# Patient Record
Sex: Female | Born: 1960
Health system: Southern US, Community
[De-identification: ages and names within clinical notes are randomized; demographics above are authoritative.]

## PROBLEM LIST (undated history)

## (undated) DIAGNOSIS — I2510 Atherosclerotic heart disease of native coronary artery without angina pectoris: Secondary | ICD-10-CM

## (undated) DIAGNOSIS — E119 Type 2 diabetes mellitus without complications: Secondary | ICD-10-CM

## (undated) DIAGNOSIS — T7840XA Allergy, unspecified, initial encounter: Secondary | ICD-10-CM

## (undated) DIAGNOSIS — I10 Essential (primary) hypertension: Secondary | ICD-10-CM

## (undated) DIAGNOSIS — E785 Hyperlipidemia, unspecified: Secondary | ICD-10-CM

## (undated) DIAGNOSIS — M542 Cervicalgia: Secondary | ICD-10-CM

## (undated) HISTORY — PX: TUBAL LIGATION: SHX77

## (undated) HISTORY — DX: Allergy, unspecified, initial encounter: T78.40XA

---

## 2019-02-22 ENCOUNTER — Other Ambulatory Visit: Payer: Self-pay | Admitting: Student

## 2019-02-22 DIAGNOSIS — M4802 Spinal stenosis, cervical region: Secondary | ICD-10-CM

## 2019-02-22 DIAGNOSIS — M47812 Spondylosis without myelopathy or radiculopathy, cervical region: Secondary | ICD-10-CM

## 2019-02-22 DIAGNOSIS — M5412 Radiculopathy, cervical region: Secondary | ICD-10-CM

## 2019-03-04 ENCOUNTER — Ambulatory Visit
Admission: RE | Admit: 2019-03-04 | Discharge: 2019-03-04 | Disposition: A | Discharge status: Home / Self Care | Payer: BC Managed Care – PPO | Source: Ambulatory Visit | Attending: Student | Admitting: Student

## 2019-03-04 ENCOUNTER — Other Ambulatory Visit: Payer: Self-pay

## 2019-03-04 DIAGNOSIS — M47812 Spondylosis without myelopathy or radiculopathy, cervical region: Secondary | ICD-10-CM | POA: Diagnosis present

## 2019-03-04 DIAGNOSIS — M5412 Radiculopathy, cervical region: Secondary | ICD-10-CM | POA: Diagnosis not present

## 2019-03-04 DIAGNOSIS — M4802 Spinal stenosis, cervical region: Secondary | ICD-10-CM | POA: Insufficient documentation

## 2020-03-07 ENCOUNTER — Encounter: Payer: Self-pay | Admitting: Internal Medicine

## 2020-03-07 ENCOUNTER — Ambulatory Visit (INDEPENDENT_AMBULATORY_CARE_PROVIDER_SITE_OTHER): Payer: 59 | Admitting: Internal Medicine

## 2020-03-07 ENCOUNTER — Other Ambulatory Visit: Payer: Self-pay

## 2020-03-07 VITALS — BP 170/79 | HR 78 | Ht 65.0 in | Wt 156.0 lb

## 2020-03-07 DIAGNOSIS — E119 Type 2 diabetes mellitus without complications: Secondary | ICD-10-CM | POA: Insufficient documentation

## 2020-03-07 DIAGNOSIS — M8949 Other hypertrophic osteoarthropathy, multiple sites: Secondary | ICD-10-CM

## 2020-03-07 DIAGNOSIS — I10 Essential (primary) hypertension: Secondary | ICD-10-CM | POA: Insufficient documentation

## 2020-03-07 DIAGNOSIS — M159 Polyosteoarthritis, unspecified: Secondary | ICD-10-CM

## 2020-03-07 DIAGNOSIS — E1169 Type 2 diabetes mellitus with other specified complication: Secondary | ICD-10-CM

## 2020-03-07 DIAGNOSIS — M542 Cervicalgia: Secondary | ICD-10-CM | POA: Diagnosis not present

## 2020-03-07 DIAGNOSIS — I152 Hypertension secondary to endocrine disorders: Secondary | ICD-10-CM

## 2020-03-07 LAB — GLUCOSE, POCT (MANUAL RESULT ENTRY): POC Glucose: 237 mg/dl — AB (ref 70–99)

## 2020-03-07 MED ORDER — KETOROLAC TROMETHAMINE 30 MG/ML IJ SOLN
30.0000 mg | Freq: Once | INTRAMUSCULAR | Status: AC
  Administered 2020-03-07: 30 mg via INTRAMUSCULAR

## 2020-03-07 MED ORDER — DICLOFENAC SODIUM 75 MG PO TBEC: 75.0000 mg | DELAYED_RELEASE_TABLET | Freq: Two times a day (BID) | ORAL | 0 refills | Status: DC

## 2020-03-07 NOTE — Assessment & Plan Note (Signed)
Patient blood pressure was found to be elevated today she has been consuming a lot of NSAIDs Tylenol because of the pain in the left shoulder neck.

## 2020-03-07 NOTE — Assessment & Plan Note (Signed)
Patient was advised to use neck brace and use a cervical pillow to sleep at night.

## 2020-03-07 NOTE — Assessment & Plan Note (Signed)
Patient blood sugar is elevated due to noncompliance with her diet.

## 2020-03-07 NOTE — Progress Notes (Signed)
Established Patient Office Visit  Subjective:  Patient ID: Elizabeth Hardin, female    DOB: 1960-08-17  Age: 60 y.o. MRN: [id number]  CC:  Chief Complaint  Patient presents with  . Shoulder Pain    Left shoulder, pain starts in her neck and runs down arm into hand. Has been going on for 2 days.     Shoulder Pain  This is a recurrent problem. The current episode started in the past 7 days. The problem occurs 2 to 4 times per day. The problem has been gradually worsening. The pain is at a severity of 9/10. The pain is severe. Associated symptoms include joint swelling and stiffness. Pertinent negatives include no fever or itching. She has tried NSAIDS for the symptoms. The treatment provided mild relief. Family history does not include gout. Her past medical history is significant for diabetes and osteoarthritis.    Elizabeth Hardin presents for shoulder pain  Lt side  History reviewed. No pertinent past medical history.  History reviewed. No pertinent surgical history.  History reviewed. No pertinent family history.  Social History   Socioeconomic History  . Marital status: Married    Spouse name: Not on file  . Number of children: Not on file  . Years of education: Not on file  . Highest education level: Not on file  Occupational History  . Not on file  Tobacco Use  . Smoking status: Current Every Day Smoker  . Smokeless tobacco: Never Used  Substance and Sexual Activity  . Alcohol use: Not Currently  . Drug use: Never  . Sexual activity: Yes  Other Topics Concern  . Not on file  Social History Narrative  . Not on file   Social Determinants of Health   Financial Resource Strain: Not on file  Food Insecurity: Not on file  Transportation Needs: Not on file  Physical Activity: Not on file  Stress: Not on file  Social Connections: Not on file  Intimate Partner Violence: Not on file     Current Outpatient Medications:  .  diclofenac (VOLTAREN) 75 MG EC  tablet, Take 1 tablet (75 mg total) by mouth 2 (two) times daily., Disp: 30 tablet, Rfl: 0   Not on File  ROS Review of Systems  Constitutional: Negative.  Negative for fever.  HENT: Negative.   Eyes: Negative.   Respiratory: Negative.   Cardiovascular: Negative.   Gastrointestinal: Negative.   Endocrine: Negative.   Genitourinary: Negative.   Musculoskeletal: Positive for arthralgias, neck pain, neck stiffness and stiffness.  Skin: Negative.  Negative for itching.  Allergic/Immunologic: Negative.   Neurological: Negative.  Negative for syncope.  Hematological: Negative.   Psychiatric/Behavioral: Negative.   All other systems reviewed and are negative.     Objective:    Physical Exam Vitals reviewed.  Constitutional:      Appearance: Normal appearance.  HENT:     Mouth/Throat:     Mouth: Mucous membranes are moist.  Eyes:     Pupils: Pupils are equal, round, and reactive to light.  Neck:     Vascular: No carotid bruit.  Cardiovascular:     Rate and Rhythm: Normal rate and regular rhythm.     Pulses: Normal pulses.     Heart sounds: Normal heart sounds.  Pulmonary:     Effort: Pulmonary effort is normal.     Breath sounds: Normal breath sounds.  Abdominal:     General: Bowel sounds are normal.     Palpations: Abdomen is soft.  There is no hepatomegaly, splenomegaly or mass.     Tenderness: There is no abdominal tenderness.     Hernia: No hernia is present.  Musculoskeletal:        General: No tenderness.     Cervical back: Neck supple.     Right lower leg: No edema.     Left lower leg: No edema.     Comments: Patient came with back pain in the shoulder region and radiated from the left to left shoulder on the left side.  Movement of the neck also causes shooting pain to the left shoulder she describes her pain to be involved 9 x 10.  Denies any history of chest pain sweating nausea vomiting trouble swallowing.  Skin:    Findings: No rash.  Neurological:      Mental Status: She is alert and oriented to person, place, and time.     Motor: No weakness.  Psychiatric:        Mood and Affect: Mood and affect normal.        Behavior: Behavior normal.     BP (!) 170/79   Pulse 78   Ht 5' 5"  (1.651 m)   Wt 156 lb (70.8 kg)   BMI 25.96 kg/m  Wt Readings from Last 3 Encounters:  03/07/20 156 lb (70.8 kg)     Health Maintenance Due  Topic Date Due  . HEMOGLOBIN A1C  Never done  . Hepatitis C Screening  Never done  . PNEUMOCOCCAL POLYSACCHARIDE VACCINE AGE 25-64 HIGH RISK  Never done  . COVID-19 Vaccine (1) Never done  . FOOT EXAM  Never done  . OPHTHALMOLOGY EXAM  Never done  . HIV Screening  Never done  . TETANUS/TDAP  Never done  . PAP SMEAR-Modifier  Never done  . COLONOSCOPY (Pts 45-35yr Insurance coverage will need to be confirmed)  Never done  . MAMMOGRAM  Never done  . INFLUENZA VACCINE  Never done    There are no preventive care reminders to display for this patient.  No results found for: TSH No results found for: WBC, HGB, HCT, MCV, PLT No results found for: NA, K, CHLORIDE, CO2, GLUCOSE, BUN, CREATININE, BILITOT, ALKPHOS, AST, ALT, PROT, ALBUMIN, CALCIUM, ANIONGAP, EGFR, GFR No results found for: CHOL No results found for: HDL No results found for: LDLCALC No results found for: TRIG No results found for: CHOLHDL No results found for: HGBA1C    Assessment & Plan:   Problem List Items Addressed This Visit      Cardiovascular and Mediastinum   Hypertension due to endocrine disorder    Patient blood pressure was found to be elevated today she has been consuming a lot of NSAIDs Tylenol because of the pain in the left shoulder neck.        Endocrine   Diabetes mellitus ([place]    Patient blood sugar is elevated due to noncompliance with her diet.      Relevant Orders   POCT glucose (manual entry) (Completed)     Musculoskeletal and Integument   Primary osteoarthritis involving multiple joints     Patient has osteoarthritis of the both hands finger joint.  she also seems to be have cervical spine osteoarthritis with impingement syndrome.  Patient was given Toradol injection and will get an x-ray of the neck as well as orthopedic consultation.      Relevant Medications   diclofenac (VOLTAREN) 75 MG EC tablet     Other   Neck pain  Patient was advised to use neck brace and use a cervical pillow to sleep at night.      Relevant Medications   diclofenac (VOLTAREN) 75 MG EC tablet   Other Relevant Orders   DG Neck Soft Tissue   Ambulatory referral to Orthopedics    Diagnosis  cervical spine osteoarthritis with impingement syndrome. Patient presented with cervical spine pain going to the left shoulder.  Movement of the neck bring all any more pain to the left shoulder region.  She describes her pain to be 9 x 10.  He was given a Toradol injection 30 mg intramuscular her blood sugar was also found to be elevated today she is taking Metformin 500 mg p.o. twice a day.  He also consumed some chocolate today because of the Valentine's Day.  Patient has been advised to stop smoking she will be referred to orthopedic surgeon.  X-ray of the neck will be obtained.    Meds ordered this encounter  Medications  . diclofenac (VOLTAREN) 75 MG EC tablet    Sig: Take 1 tablet (75 mg total) by mouth 2 (two) times daily.    Dispense:  30 tablet    Refill:  0  . ketorolac (TORADOL) 30 MG/ML injection 30 mg    Follow-up: No follow-ups on file.    Cletis Athens, MD

## 2020-03-07 NOTE — Assessment & Plan Note (Addendum)
Patient has osteoarthritis of the both hands finger joint.  she also seems to be have cervical spine osteoarthritis with impingement syndrome.  Patient was given Toradol injection and will get an x-ray of the neck as well as orthopedic consultation.

## 2020-03-08 ENCOUNTER — Ambulatory Visit
Admission: RE | Admit: 2020-03-08 | Discharge: 2020-03-08 | Disposition: A | Discharge status: Home / Self Care | Payer: 59 | Source: Ambulatory Visit | Attending: Internal Medicine | Admitting: Internal Medicine

## 2020-03-08 ENCOUNTER — Ambulatory Visit
Admission: RE | Admit: 2020-03-08 | Discharge: 2020-03-08 | Disposition: A | Discharge status: Home / Self Care | Payer: 59 | Attending: Internal Medicine | Admitting: Internal Medicine

## 2020-03-08 DIAGNOSIS — M542 Cervicalgia: Secondary | ICD-10-CM | POA: Diagnosis not present

## 2020-03-13 ENCOUNTER — Other Ambulatory Visit: Payer: Self-pay

## 2020-03-13 ENCOUNTER — Ambulatory Visit (INDEPENDENT_AMBULATORY_CARE_PROVIDER_SITE_OTHER): Payer: 59 | Admitting: Internal Medicine

## 2020-03-13 VITALS — BP 145/80 | HR 84 | Ht 64.0 in | Wt 152.9 lb

## 2020-03-13 DIAGNOSIS — I152 Hypertension secondary to endocrine disorders: Secondary | ICD-10-CM

## 2020-03-13 DIAGNOSIS — M159 Polyosteoarthritis, unspecified: Secondary | ICD-10-CM

## 2020-03-13 DIAGNOSIS — M8949 Other hypertrophic osteoarthropathy, multiple sites: Secondary | ICD-10-CM

## 2020-03-13 DIAGNOSIS — M150 Primary generalized (osteo)arthritis: Secondary | ICD-10-CM

## 2020-03-13 DIAGNOSIS — M542 Cervicalgia: Secondary | ICD-10-CM

## 2020-03-13 DIAGNOSIS — E1169 Type 2 diabetes mellitus with other specified complication: Secondary | ICD-10-CM

## 2020-03-13 MED ORDER — CYCLOBENZAPRINE HCL 5 MG PO TABS: 5.0000 mg | ORAL_TABLET | Freq: Three times a day (TID) | ORAL | 1 refills | Status: DC | PRN

## 2020-03-13 NOTE — Assessment & Plan Note (Addendum)
Pt has degenerative disease of the multiple joint in the cervical spine she also has left forearm pain and narrowing of the spinal cord, compression at one level.  Patient will be sent to the specialist for further evaluation.  In the meantime patient was given some Flexeril 5 mg p.o. twice a day.  And also multivitamin tablet twice a day.  She was advised with neck brace

## 2020-03-13 NOTE — Assessment & Plan Note (Signed)
Patient will be referred to the cervical spine specialist.

## 2020-03-13 NOTE — Progress Notes (Signed)
Established Patient Office Visit  Subjective:  Patient ID: Elizabeth Hardin, female    DOB: December 17, 1960  Age: 60 y.o. MRN: [id number]  CC:  Chief Complaint  Patient presents with  . Neck Pain    Patient reports she is still having pain and the pain radiates to both shoulders.    HPI  [name]     Patient came in as a follow-up from the neck pain.she continues to complain of pain in the right shoulder and left shoulder.  An MRI was obtained in the hospital.  Which revealed cervical spine arthritis there is a minimal compression of the cervical canal C5-C7 level there is no evidence of myelopathy according to MRI.  There is a multiple degenerative arthritis noted C3-C4 and C5 and 6.  Disc space narrowing was also noted         No family history on file.  Social History   Socioeconomic History  . Marital status: Married    Spouse name: Not on file  . Number of children: Not on file  . Years of education: Not on file  . Highest education level: Not on file  Occupational History  . Not on file  Tobacco Use  . Smoking status: Current Every Day Smoker  . Smokeless tobacco: Never Used  Substance and Sexual Activity  . Alcohol use: Not Currently  . Drug use: Never  . Sexual activity: Yes  Other Topics Concern  . Not on file  Social History Narrative  . Not on file   Social Determinants of Health   Financial Resource Strain: Not on file  Food Insecurity: Not on file  Transportation Needs: Not on file  Physical Activity: Not on file  Stress: Not on file  Social Connections: Not on file  Intimate Partner Violence: Not on file     Current Outpatient Medications:  .  cyclobenzaprine (FLEXERIL) 5 MG tablet, Take 1 tablet (5 mg total) by mouth 3 (three) times daily as needed for muscle spasms., Disp: 30 tablet, Rfl: 1 .  diclofenac (VOLTAREN) 75 MG EC tablet, Take 1 tablet (75 mg total) by mouth 2 (two) times daily., Disp: 30 tablet, Rfl: 0   Not on  File  ROS Review of Systems  Constitutional: Negative.   HENT: Negative.   Eyes: Negative.   Respiratory: Negative.   Cardiovascular: Negative.   Gastrointestinal: Negative.   Endocrine: Negative.   Genitourinary: Negative.   Musculoskeletal: Positive for arthralgias and back pain. Negative for gait problem.       Patient has both shoulder pain and cervical spine pain with radiculopathy  Skin: Negative.   Allergic/Immunologic: Negative.   Neurological: Negative.   Hematological: Negative.   Psychiatric/Behavioral: Negative.   All other systems reviewed and are negative.     Objective:    Physical Exam Vitals reviewed.  Constitutional:      Appearance: Normal appearance.  HENT:     Mouth/Throat:     Mouth: Mucous membranes are moist.  Eyes:     Pupils: Pupils are equal, round, and reactive to light.  Neck:     Vascular: No carotid bruit.  Cardiovascular:     Rate and Rhythm: Normal rate and regular rhythm.     Pulses: Normal pulses.     Heart sounds: Normal heart sounds.  Pulmonary:     Effort: Pulmonary effort is normal.     Breath sounds: Normal breath sounds. No rhonchi or rales.  Abdominal:     General:  Bowel sounds are normal.     Palpations: Abdomen is soft. There is no hepatomegaly, splenomegaly or mass.     Tenderness: There is no abdominal tenderness.     Hernia: No hernia is present.  Musculoskeletal:        General: No tenderness.     Cervical back: Rigidity present.     Right lower leg: No edema.     Left lower leg: No edema.  Lymphadenopathy:     Cervical: No cervical adenopathy.  Skin:    Findings: No rash.  Neurological:     Mental Status: She is alert and oriented to person, place, and time.     Motor: No weakness.  Psychiatric:        Mood and Affect: Mood and affect normal.        Behavior: Behavior normal.     BP (!) 145/80   Pulse 84   Ht 5' 4"  (1.626 m)   Wt 152 lb 14.4 oz (69.4 kg)   SpO2 98%   BMI 26.25 kg/m  Wt Readings  from Last 3 Encounters:  03/14/20 152 lb 14.4 oz (69.4 kg)  03/07/20 156 lb (70.8 kg)     Health Maintenance Due  Topic Date Due  . HEMOGLOBIN A1C  Never done  . Hepatitis C Screening  Never done  . PNEUMOCOCCAL POLYSACCHARIDE VACCINE AGE 72-64 HIGH RISK  Never done  . COVID-19 Vaccine (1) Never done  . FOOT EXAM  Never done  . OPHTHALMOLOGY EXAM  Never done  . URINE MICROALBUMIN  Never done  . HIV Screening  Never done  . TETANUS/TDAP  Never done  . PAP SMEAR-Modifier  Never done  . COLONOSCOPY (Pts 45-14yr Insurance coverage will need to be confirmed)  Never done  . MAMMOGRAM  Never done  . INFLUENZA VACCINE  Never done    There are no preventive care reminders to display for this patient.  No results found for: TSH No results found for: WBC, HGB, HCT, MCV, PLT No results found for: NA, K, CHLORIDE, CO2, GLUCOSE, BUN, CREATININE, BILITOT, ALKPHOS, AST, ALT, PROT, ALBUMIN, CALCIUM, ANIONGAP, EGFR, GFR No results found for: CHOL No results found for: HDL No results found for: LDLCALC No results found for: TRIG No results found for: CHOLHDL No results found for: HGBA1C    Assessment & Plan:   Problem List Items Addressed This Visit      Cardiovascular and Mediastinum   Hypertension due to endocrine disorder    Blood pressure is stable at the present time        Endocrine   Diabetes mellitus [place]    Diabetes seems to be under control        Musculoskeletal and Integument   Primary osteoarthritis involving multiple joints - Primary    Pt has degenerative disease of the multiple joint in the cervical spine she also has left forearm pain and narrowing of the spinal cord, compression at one level.  Patient will be sent to the specialist for further evaluation.  In the meantime patient was given some Flexeril 5 mg p.o. twice a day.  And also multivitamin tablet twice a day.  She was advised with neck brace      Relevant Medications   cyclobenzaprine (FLEXERIL) 5 MG  tablet     Other   Neck pain    Patient will be referred to the cervical spine specialist.      Relevant Medications   cyclobenzaprine (FLEXERIL) 5 MG tablet  Meds ordered this encounter  Medications  . cyclobenzaprine (FLEXERIL) 5 MG tablet    Sig: Take 1 tablet (5 mg total) by mouth 3 (three) times daily as needed for muscle spasms.    Dispense:  30 tablet    Refill:  1    Follow-up: No follow-ups on file.    Cletis Athens, MD

## 2020-03-13 NOTE — Assessment & Plan Note (Signed)
Diabetes seems to be under control

## 2020-03-13 NOTE — Assessment & Plan Note (Signed)
Blood pressure is stable at the present time 

## 2020-03-14 ENCOUNTER — Encounter: Payer: Self-pay | Admitting: Internal Medicine

## 2020-03-17 ENCOUNTER — Encounter: Payer: Self-pay | Admitting: Internal Medicine

## 2020-04-03 ENCOUNTER — Ambulatory Visit: Payer: 59 | Admitting: Internal Medicine

## 2020-11-23 ENCOUNTER — Other Ambulatory Visit: Payer: Self-pay

## 2020-11-23 ENCOUNTER — Emergency Department: Payer: 59

## 2020-11-23 ENCOUNTER — Emergency Department
Admission: EM | Admit: 2020-11-23 | Discharge: 2020-11-23 | Disposition: A | Discharge status: Home / Self Care | Payer: 59 | Attending: Emergency Medicine | Admitting: Emergency Medicine

## 2020-11-23 DIAGNOSIS — F17200 Nicotine dependence, unspecified, uncomplicated: Secondary | ICD-10-CM | POA: Insufficient documentation

## 2020-11-23 DIAGNOSIS — E119 Type 2 diabetes mellitus without complications: Secondary | ICD-10-CM | POA: Diagnosis not present

## 2020-11-23 DIAGNOSIS — R1011 Right upper quadrant pain: Secondary | ICD-10-CM | POA: Diagnosis not present

## 2020-11-23 DIAGNOSIS — R109 Unspecified abdominal pain: Secondary | ICD-10-CM

## 2020-11-23 DIAGNOSIS — I10 Essential (primary) hypertension: Secondary | ICD-10-CM | POA: Insufficient documentation

## 2020-11-23 LAB — URINALYSIS, ROUTINE W REFLEX MICROSCOPIC
Bilirubin Urine: NEGATIVE
Glucose, UA: >=500 mg/dL — AB
Ketones, ur: 20 mg/dL — AB
Leukocytes,Ua: NEGATIVE
Nitrite: NEGATIVE
Protein, ur: NEGATIVE mg/dL
Specific Gravity, Urine: 1.023 (ref 1.005–1.030)
pH: 5 (ref 5.0–8.0)

## 2020-11-23 LAB — COMPREHENSIVE METABOLIC PANEL
ALT: 15 U/L (ref 0–44)
AST: 15 U/L (ref 15–41)
Albumin: 4.3 g/dL (ref 3.5–5.0)
Alkaline Phosphatase: 55 U/L (ref 38–126)
Anion gap: 9 (ref 5–15)
BUN: 17 mg/dL (ref 8–23)
CO2: 22 mmol/L (ref 22–32)
Calcium: 9.6 mg/dL (ref 8.9–10.3)
Chloride: 104 mmol/L (ref 98–111)
Creatinine, Ser: 0.76 mg/dL (ref 0.44–1.00)
GFR, Estimated: >60 mL/min (ref >60)
Glucose, Bld: 288 mg/dL — ABNORMAL HIGH (ref 70–99)
Potassium: 4.1 mmol/L (ref 3.5–5.1)
Sodium: 135 mmol/L (ref 135–145)
Total Bilirubin: 1 mg/dL (ref 0.3–1.2)
Total Protein: 7.3 g/dL (ref 6.5–8.1)

## 2020-11-23 LAB — LIPASE, BLOOD: Lipase: 36 U/L (ref 11–51)

## 2020-11-23 LAB — CBC
HCT: 39 % (ref 36.0–46.0)
Hemoglobin: 13.6 g/dL (ref 12.0–15.0)
MCH: 29.3 pg (ref 26.0–34.0)
MCHC: 34.9 g/dL (ref 30.0–36.0)
MCV: 84.1 fL (ref 80.0–100.0)
Platelets: 230 10*3/uL (ref 150–400)
RBC: 4.64 MIL/uL (ref 3.87–5.11)
RDW: 13.2 % (ref 11.5–15.5)
WBC: 7.2 10*3/uL (ref 4.0–10.5)
nRBC: 0.3 % — ABNORMAL HIGH (ref 0.0–0.2)

## 2020-11-23 MED ORDER — IOHEXOL 300 MG/ML  SOLN
100.0000 mL | Freq: Once | INTRAMUSCULAR | Status: DC | PRN
  Filled 2020-11-23: qty 100

## 2020-11-23 MED ORDER — IOHEXOL 350 MG/ML SOLN
80.0000 mL | Freq: Once | INTRAVENOUS | Status: AC | PRN
  Administered 2020-11-23: 80 mL via INTRAVENOUS
  Filled 2020-11-23: qty 80

## 2020-11-23 NOTE — ED Provider Notes (Signed)
[facility] Emergency Department Provider Note   ____________________________________________   I have reviewed the triage vital signs and the nursing notes.   HISTORY  Chief Complaint Abdominal Pain   History limited by: Not Limited   HPI Elizabeth Hardin is a 60 y.o. female who presents to the emergency department today because of concerns for abdominal pain.  Patient states she was at work when the pain started.  She was not doing any abnormal exertion.  She states the pain was located in the right upper abdomen and then radiated down the right side of her abdomen.  It was accompanied by some nausea and she felt like she might vomit.  At the time my exam the pain has improved.  She states that she still has some nagging feeling on that side.  Denies similar symptoms in the past.  No recent change in urine or bowel movements.  Patient denies any fevers.  No recent travel or unusual ingestions.   Records reviewed. Per medical record review patient has a history of diabetes. Hypertension.  History reviewed. No pertinent past medical history.  Patient Active Problem List   Diagnosis Date Noted   Neck pain 03/07/2020   Diabetes mellitus (HCC) 03/07/2020   Primary osteoarthritis involving multiple joints 03/07/2020   Hypertension due to endocrine disorder 03/07/2020    History reviewed. No pertinent surgical history.  Prior to Admission medications   Medication Sig Start Date End Date Taking? Authorizing Provider  cyclobenzaprine (FLEXERIL) 5 MG tablet Take 1 tablet (5 mg total) by mouth 3 (three) times daily as needed for muscle spasms. 03/13/20   Corky Downs, MD  diclofenac (VOLTAREN) 75 MG EC tablet Take 1 tablet (75 mg total) by mouth 2 (two) times daily. 03/07/20   Corky Downs, MD    Allergies Patient has no allergy information on record.  No family history on file.  Social History Social History   Tobacco Use   Smoking status: Every Day    Smokeless tobacco: Never  Substance Use Topics   Alcohol use: Not Currently   Drug use: Never    Review of Systems Constitutional: No fever/chills Eyes: No visual changes. ENT: No sore throat. Cardiovascular: Denies chest pain. Respiratory: Denies shortness of breath. Gastrointestinal: Positive for abdominal pain, nausea, vomiting.  Genitourinary: Negative for dysuria. Musculoskeletal: Negative for back pain. Skin: Negative for rash. Neurological: Negative for headaches, focal weakness or numbness.  ____________________________________________   PHYSICAL EXAM:  VITAL SIGNS: ED Triage Vitals  Enc Vitals Group     BP 11/23/20 1120 (!) 198/77     Pulse Rate 11/23/20 1120 61     Resp 11/23/20 1120 20     Temp 11/23/20 1120 98.9 F (37.2 C)     Temp Source 11/23/20 1120 Oral     SpO2 11/23/20 1120 100 %     Weight --      Height --      Head Circumference --      Peak Flow --      Pain Score 11/23/20 1120 10   Constitutional: Alert and oriented.  Eyes: Conjunctivae are normal.  ENT      Head: Normocephalic and atraumatic.      Nose: No congestion/rhinnorhea.      Mouth/Throat: Mucous membranes are moist.      Neck: No stridor. Hematological/Lymphatic/Immunilogical: No cervical lymphadenopathy. Cardiovascular: Normal rate, regular rhythm.  No murmurs, rubs, or gallops.  Respiratory: Normal respiratory effort without tachypnea nor retractions. Breath sounds  are clear and equal bilaterally. No wheezes/rales/rhonchi. Gastrointestinal: Soft and non tender. No rebound. No guarding.  Genitourinary: Deferred Musculoskeletal: Normal range of motion in all extremities. No lower extremity edema. Neurologic:  Normal speech and language. No gross focal neurologic deficits are appreciated.  Skin:  Skin is warm, dry and intact. No rash noted. Psychiatric: Mood and affect are normal. Speech and behavior are normal. Patient exhibits appropriate insight and  judgment.  ____________________________________________    LABS (pertinent positives/negatives)  Lipase 36 CMP wnl except glu 288 CBC wbc 7.2, hgb 13.6, plt 230  ____________________________________________   EKG  None  ____________________________________________    RADIOLOGY  CT abd/pel No acute abnormality. Stool mildly distending cecum.   ____________________________________________   PROCEDURES  Procedures  ____________________________________________   INITIAL IMPRESSION / ASSESSMENT AND PLAN / ED COURSE  Pertinent labs & imaging results that were available during my care of the patient were reviewed by me and considered in my medical decision making (see chart for details).   Patient presented to the emergency department today with concerns for right-sided abdominal pain.  By the time of exam the pain had significantly improved.  However given concern for possible appendicitis versus gallbladder disease did obtain CT scan.  While this did not show any acute abnormality did show mildly distended cecum.  I discussed this with the patient.  Do wonder if this caused the patient's discomfort.  At this point given the patient feels significant provement do not feel any further emergent work-up or evaluation is needed.  Will plan on discharging home to follow-up with primary care.   ____________________________________________   FINAL CLINICAL IMPRESSION(S) / ED DIAGNOSES  Final diagnoses:  Abdominal pain, unspecified abdominal location     Note: This dictation was prepared with Dragon dictation. Any transcriptional errors that result from this process are unintentional     Phineas Semen, MD 11/23/20 1625

## 2020-11-23 NOTE — ED Notes (Signed)
See triage note  presents with right upper abd pain  pain started yesterday  some nausea  no fever  states pain stopped while in lobby

## 2020-11-23 NOTE — Discharge Instructions (Signed)
Please seek medical attention for any high fevers, chest pain, shortness of breath, change in behavior, persistent vomiting, bloody stool or any other new or concerning symptoms.  

## 2020-11-23 NOTE — ED Triage Notes (Addendum)
Pt comes with c/o right sided abdominal pain. Pt states this started yesterday. Pt states some nausea. Pt denies any vomiting.

## 2021-01-26 ENCOUNTER — Other Ambulatory Visit: Payer: Self-pay

## 2021-01-26 ENCOUNTER — Encounter
Admission: EM | Disposition: A | Discharge status: Home / Self Care | Payer: Self-pay | Source: Home / Self Care | Attending: Obstetrics and Gynecology

## 2021-01-26 ENCOUNTER — Encounter: Payer: Self-pay | Admitting: Emergency Medicine

## 2021-01-26 ENCOUNTER — Emergency Department: Payer: 59

## 2021-01-26 ENCOUNTER — Inpatient Hospital Stay
Admission: EM | Admit: 2021-01-26 | Discharge: 2021-01-28 | DRG: 247 | Disposition: A | Discharge status: Home / Self Care | Payer: 59 | Attending: Obstetrics and Gynecology | Admitting: Obstetrics and Gynecology

## 2021-01-26 DIAGNOSIS — Z23 Encounter for immunization: Secondary | ICD-10-CM | POA: Diagnosis not present

## 2021-01-26 DIAGNOSIS — F17200 Nicotine dependence, unspecified, uncomplicated: Secondary | ICD-10-CM

## 2021-01-26 DIAGNOSIS — G8929 Other chronic pain: Secondary | ICD-10-CM | POA: Diagnosis present

## 2021-01-26 DIAGNOSIS — I214 Non-ST elevation (NSTEMI) myocardial infarction: Secondary | ICD-10-CM | POA: Diagnosis present

## 2021-01-26 DIAGNOSIS — E119 Type 2 diabetes mellitus without complications: Secondary | ICD-10-CM | POA: Diagnosis present

## 2021-01-26 DIAGNOSIS — I493 Ventricular premature depolarization: Secondary | ICD-10-CM | POA: Diagnosis present

## 2021-01-26 DIAGNOSIS — I5020 Unspecified systolic (congestive) heart failure: Secondary | ICD-10-CM | POA: Diagnosis present

## 2021-01-26 DIAGNOSIS — I255 Ischemic cardiomyopathy: Secondary | ICD-10-CM | POA: Diagnosis present

## 2021-01-26 DIAGNOSIS — F17210 Nicotine dependence, cigarettes, uncomplicated: Secondary | ICD-10-CM | POA: Diagnosis present

## 2021-01-26 DIAGNOSIS — I10 Essential (primary) hypertension: Secondary | ICD-10-CM

## 2021-01-26 DIAGNOSIS — I2102 ST elevation (STEMI) myocardial infarction involving left anterior descending coronary artery: Secondary | ICD-10-CM

## 2021-01-26 DIAGNOSIS — Z20822 Contact with and (suspected) exposure to covid-19: Secondary | ICD-10-CM | POA: Diagnosis present

## 2021-01-26 DIAGNOSIS — Z8249 Family history of ischemic heart disease and other diseases of the circulatory system: Secondary | ICD-10-CM

## 2021-01-26 DIAGNOSIS — I213 ST elevation (STEMI) myocardial infarction of unspecified site: Secondary | ICD-10-CM

## 2021-01-26 DIAGNOSIS — I2109 ST elevation (STEMI) myocardial infarction involving other coronary artery of anterior wall: Secondary | ICD-10-CM | POA: Diagnosis not present

## 2021-01-26 DIAGNOSIS — I110 Hypertensive heart disease with heart failure: Secondary | ICD-10-CM | POA: Diagnosis present

## 2021-01-26 DIAGNOSIS — R079 Chest pain, unspecified: Secondary | ICD-10-CM

## 2021-01-26 DIAGNOSIS — I2510 Atherosclerotic heart disease of native coronary artery without angina pectoris: Secondary | ICD-10-CM | POA: Diagnosis present

## 2021-01-26 HISTORY — DX: Cervicalgia: M54.2

## 2021-01-26 HISTORY — DX: Type 2 diabetes mellitus without complications: E11.9

## 2021-01-26 HISTORY — DX: Essential (primary) hypertension: I10

## 2021-01-26 HISTORY — PX: LEFT HEART CATH AND CORONARY ANGIOGRAPHY: CATH118249

## 2021-01-26 HISTORY — PX: CORONARY/GRAFT ACUTE MI REVASCULARIZATION: CATH118305

## 2021-01-26 LAB — TROPONIN I (HIGH SENSITIVITY)
Troponin I (High Sensitivity): 150 ng/L (ref <18)
Troponin I (High Sensitivity): 231 ng/L (ref <18)

## 2021-01-26 LAB — RESP PANEL BY RT-PCR (FLU A&B, COVID) ARPGX2
Influenza A by PCR: NEGATIVE
Influenza B by PCR: NEGATIVE
SARS Coronavirus 2 by RT PCR: NEGATIVE

## 2021-01-26 LAB — BASIC METABOLIC PANEL
Anion gap: 10 (ref 5–15)
BUN: 22 mg/dL (ref 8–23)
CO2: 22 mmol/L (ref 22–32)
Calcium: 9.5 mg/dL (ref 8.9–10.3)
Chloride: 99 mmol/L (ref 98–111)
Creatinine, Ser: 0.82 mg/dL (ref 0.44–1.00)
GFR, Estimated: >60 mL/min (ref >60)
Glucose, Bld: 308 mg/dL — ABNORMAL HIGH (ref 70–99)
Potassium: 4 mmol/L (ref 3.5–5.1)
Sodium: 131 mmol/L — ABNORMAL LOW (ref 135–145)

## 2021-01-26 LAB — CBC
HCT: 42.4 % (ref 36.0–46.0)
Hemoglobin: 14.2 g/dL (ref 12.0–15.0)
MCH: 28.5 pg (ref 26.0–34.0)
MCHC: 33.5 g/dL (ref 30.0–36.0)
MCV: 85 fL (ref 80.0–100.0)
Platelets: 249 10*3/uL (ref 150–400)
RBC: 4.99 MIL/uL (ref 3.87–5.11)
RDW: 12.9 % (ref 11.5–15.5)
WBC: 11.7 10*3/uL — ABNORMAL HIGH (ref 4.0–10.5)
nRBC: 0 % (ref 0.0–0.2)

## 2021-01-26 LAB — APTT: aPTT: 26 seconds (ref 24–36)

## 2021-01-26 LAB — LIPID PANEL
Cholesterol: 167 mg/dL (ref 0–200)
HDL: 57 mg/dL (ref >40)
LDL Cholesterol: 99 mg/dL (ref 0–99)
Total CHOL/HDL Ratio: 2.9 RATIO
Triglycerides: 57 mg/dL (ref <150)
VLDL: 11 mg/dL (ref 0–40)

## 2021-01-26 LAB — HEMOGLOBIN A1C
Hgb A1c MFr Bld: 9.7 % — ABNORMAL HIGH (ref 4.8–5.6)
Mean Plasma Glucose: 231.69 mg/dL

## 2021-01-26 LAB — TSH: TSH: 1.47 u[IU]/mL (ref 0.350–4.500)

## 2021-01-26 LAB — POCT ACTIVATED CLOTTING TIME
Activated Clotting Time: 329 seconds
Activated Clotting Time: 281 seconds
Activated Clotting Time: 341 seconds
Activated Clotting Time: 468 seconds

## 2021-01-26 LAB — PROTIME-INR
INR: 0.9 (ref 0.8–1.2)
Prothrombin Time: 12.4 seconds (ref 11.4–15.2)

## 2021-01-26 LAB — MAGNESIUM: Magnesium: 1.9 mg/dL (ref 1.7–2.4)

## 2021-01-26 SURGERY — CORONARY/GRAFT ACUTE MI REVASCULARIZATION
Anesthesia: Moderate Sedation

## 2021-01-26 MED ORDER — LIDOCAINE HCL 1 % IJ SOLN
INTRAMUSCULAR | Status: AC
  Filled 2021-01-26: qty 20

## 2021-01-26 MED ORDER — ATORVASTATIN CALCIUM 80 MG PO TABS
80.0000 mg | ORAL_TABLET | Freq: Every day | ORAL | Status: DC
  Administered 2021-01-26 – 2021-01-28 (×3): 80 mg via ORAL
  Filled 2021-01-26 (×3): qty 1

## 2021-01-26 MED ORDER — SODIUM CHLORIDE 0.9% FLUSH: 3.0000 mL | INTRAVENOUS | Status: DC | PRN

## 2021-01-26 MED ORDER — ONDANSETRON HCL 4 MG/2ML IJ SOLN: 4.0000 mg | Freq: Four times a day (QID) | INTRAMUSCULAR | Status: DC | PRN

## 2021-01-26 MED ORDER — ATORVASTATIN CALCIUM 80 MG PO TABS
80.0000 mg | ORAL_TABLET | Freq: Every day | ORAL | Status: DC
Start: 2021-01-26 — End: 2021-01-26

## 2021-01-26 MED ORDER — VERAPAMIL HCL 2.5 MG/ML IV SOLN
INTRAVENOUS | Status: DC | PRN
  Administered 2021-01-26: 2.5 mg via INTRAVENOUS

## 2021-01-26 MED ORDER — ACETAMINOPHEN 325 MG PO TABS: 650.0000 mg | ORAL_TABLET | ORAL | Status: DC | PRN

## 2021-01-26 MED ORDER — METOPROLOL TARTRATE 25 MG PO TABS
25.0000 mg | ORAL_TABLET | Freq: Two times a day (BID) | ORAL | Status: DC
  Administered 2021-01-26 – 2021-01-28 (×3): 25 mg via ORAL
  Filled 2021-01-26 (×5): qty 1

## 2021-01-26 MED ORDER — CHLORHEXIDINE GLUCONATE CLOTH 2 % EX PADS
6.0000 | MEDICATED_PAD | Freq: Every day | CUTANEOUS | Status: DC
  Administered 2021-01-27: 6 via TOPICAL

## 2021-01-26 MED ORDER — TICAGRELOR 90 MG PO TABS
ORAL_TABLET | ORAL | Status: AC
  Filled 2021-01-26: qty 2

## 2021-01-26 MED ORDER — SODIUM CHLORIDE 0.9% FLUSH
3.0000 mL | Freq: Two times a day (BID) | INTRAVENOUS | Status: DC
  Administered 2021-01-26 – 2021-01-28 (×4): 3 mL via INTRAVENOUS

## 2021-01-26 MED ORDER — IOHEXOL 300 MG/ML  SOLN
INTRAMUSCULAR | Status: DC | PRN
  Administered 2021-01-26: 355 mL

## 2021-01-26 MED ORDER — TICAGRELOR 90 MG PO TABS
ORAL_TABLET | ORAL | Status: DC | PRN
  Administered 2021-01-26: 180 mg via ORAL

## 2021-01-26 MED ORDER — HEPARIN BOLUS VIA INFUSION
4000.0000 [IU] | Freq: Once | INTRAVENOUS | Status: AC
  Administered 2021-01-26: 4000 [IU] via INTRAVENOUS
  Filled 2021-01-26: qty 4000

## 2021-01-26 MED ORDER — MIDAZOLAM HCL 2 MG/2ML IJ SOLN
INTRAMUSCULAR | Status: AC
  Filled 2021-01-26: qty 2

## 2021-01-26 MED ORDER — FENTANYL CITRATE (PF) 100 MCG/2ML IJ SOLN
INTRAMUSCULAR | Status: AC
  Filled 2021-01-26: qty 2

## 2021-01-26 MED ORDER — HYDRALAZINE HCL 20 MG/ML IJ SOLN: 10.0000 mg | INTRAMUSCULAR | Status: AC | PRN

## 2021-01-26 MED ORDER — LABETALOL HCL 5 MG/ML IV SOLN: 10.0000 mg | INTRAVENOUS | Status: AC | PRN

## 2021-01-26 MED ORDER — HEPARIN (PORCINE) IN NACL 1000-0.9 UT/500ML-% IV SOLN
INTRAVENOUS | Status: AC
  Filled 2021-01-26: qty 1000

## 2021-01-26 MED ORDER — ASPIRIN 81 MG PO CHEW
81.0000 mg | CHEWABLE_TABLET | Freq: Every day | ORAL | Status: DC
  Administered 2021-01-27 – 2021-01-28 (×2): 81 mg via ORAL
  Filled 2021-01-26 (×2): qty 1

## 2021-01-26 MED ORDER — INFLUENZA VAC SPLIT QUAD 0.5 ML IM SUSY
0.5000 mL | PREFILLED_SYRINGE | INTRAMUSCULAR | Status: AC
  Administered 2021-01-28: 0.5 mL via INTRAMUSCULAR
  Filled 2021-01-26: qty 0.5

## 2021-01-26 MED ORDER — ASPIRIN 81 MG PO CHEW
324.0000 mg | CHEWABLE_TABLET | Freq: Once | ORAL | Status: AC
Start: 2021-01-26 — End: 2021-01-26
  Administered 2021-01-26: 324 mg via ORAL
  Filled 2021-01-26: qty 4

## 2021-01-26 MED ORDER — HEPARIN (PORCINE) 25000 UT/250ML-% IV SOLN
850.0000 [IU]/h | INTRAVENOUS | Status: DC
  Administered 2021-01-26: 850 [IU]/h via INTRAVENOUS
  Filled 2021-01-26: qty 250

## 2021-01-26 MED ORDER — LIDOCAINE HCL (PF) 1 % IJ SOLN
INTRAMUSCULAR | Status: DC | PRN
  Administered 2021-01-26: 2 mL

## 2021-01-26 MED ORDER — NICOTINE 14 MG/24HR TD PT24
14.0000 mg | MEDICATED_PATCH | Freq: Every day | TRANSDERMAL | Status: DC
  Administered 2021-01-26 – 2021-01-28 (×3): 14 mg via TRANSDERMAL
  Filled 2021-01-26 (×3): qty 1

## 2021-01-26 MED ORDER — NITROGLYCERIN 0.4 MG SL SUBL
0.4000 mg | SUBLINGUAL_TABLET | SUBLINGUAL | Status: DC | PRN
  Administered 2021-01-26 (×2): 0.4 mg via SUBLINGUAL
  Filled 2021-01-26: qty 1

## 2021-01-26 MED ORDER — VERAPAMIL HCL 2.5 MG/ML IV SOLN
INTRAVENOUS | Status: AC
  Filled 2021-01-26: qty 2

## 2021-01-26 MED ORDER — SODIUM CHLORIDE 0.9% FLUSH
3.0000 mL | Freq: Two times a day (BID) | INTRAVENOUS | Status: DC
  Administered 2021-01-27 – 2021-01-28 (×4): 3 mL via INTRAVENOUS

## 2021-01-26 MED ORDER — TIZANIDINE HCL 4 MG PO TABS
4.0000 mg | ORAL_TABLET | Freq: Two times a day (BID) | ORAL | Status: DC | PRN
  Filled 2021-01-26: qty 1

## 2021-01-26 MED ORDER — TICAGRELOR 90 MG PO TABS
90.0000 mg | ORAL_TABLET | Freq: Two times a day (BID) | ORAL | Status: DC
  Administered 2021-01-26 – 2021-01-28 (×4): 90 mg via ORAL
  Filled 2021-01-26 (×4): qty 1

## 2021-01-26 MED ORDER — NITROGLYCERIN 1 MG/10 ML FOR IR/CATH LAB
INTRA_ARTERIAL | Status: DC | PRN
  Administered 2021-01-26: 200 ug via INTRACORONARY

## 2021-01-26 MED ORDER — HEPARIN (PORCINE) IN NACL 1000-0.9 UT/500ML-% IV SOLN
INTRAVENOUS | Status: AC
  Filled 2021-01-26: qty 500

## 2021-01-26 MED ORDER — SODIUM CHLORIDE 0.9 % IV SOLN: 250.0000 mL | INTRAVENOUS | Status: DC | PRN

## 2021-01-26 MED ORDER — HEPARIN SODIUM (PORCINE) 1000 UNIT/ML IJ SOLN
INTRAMUSCULAR | Status: AC
  Filled 2021-01-26: qty 10

## 2021-01-26 MED ORDER — HEPARIN (PORCINE) IN NACL 1000-0.9 UT/500ML-% IV SOLN
INTRAVENOUS | Status: DC | PRN
  Administered 2021-01-26 (×3): 500 mL

## 2021-01-26 MED ORDER — FENTANYL CITRATE (PF) 100 MCG/2ML IJ SOLN
INTRAMUSCULAR | Status: DC | PRN
  Administered 2021-01-26 (×2): 12.5 ug via INTRAVENOUS

## 2021-01-26 MED ORDER — HEPARIN SODIUM (PORCINE) 1000 UNIT/ML IJ SOLN
INTRAMUSCULAR | Status: DC | PRN
  Administered 2021-01-26: 3000 [IU] via INTRAVENOUS
  Administered 2021-01-26 (×2): 5000 [IU] via INTRAVENOUS

## 2021-01-26 MED ORDER — SODIUM CHLORIDE 0.9 % WEIGHT BASED INFUSION
1.0000 mL/kg/h | INTRAVENOUS | Status: AC
  Administered 2021-01-26: 1 mL/kg/h via INTRAVENOUS

## 2021-01-26 MED ORDER — MIDAZOLAM HCL 2 MG/2ML IJ SOLN
INTRAMUSCULAR | Status: DC | PRN
  Administered 2021-01-26 (×2): .5 mg via INTRAVENOUS

## 2021-01-26 MED ORDER — ACETAMINOPHEN 325 MG PO TABS
650.0000 mg | ORAL_TABLET | ORAL | Status: DC | PRN
  Administered 2021-01-26: 650 mg via ORAL
  Filled 2021-01-26: qty 2

## 2021-01-26 SURGICAL SUPPLY — 30 items
BALLN EUPHORA RX 2.0X15 (BALLOONS) ×2
BALLN MINITREK RX 2.0X20 (BALLOONS) ×2
BALLN TREK OTW 2X8 (BALLOONS) ×2
BALLN ~~LOC~~ EUPHORA RX 2.75X20 (BALLOONS) ×2
BALLN ~~LOC~~ TREK RX 2.25X12 (BALLOONS) ×2
BALLOON EUPHORA RX 2.0X15 (BALLOONS) IMPLANT
BALLOON MINITREK RX 2.0X20 (BALLOONS) IMPLANT
BALLOON TREK OTW 2X8 (BALLOONS) IMPLANT
BALLOON ~~LOC~~ EUPHORA RX 2.75X20 (BALLOONS) IMPLANT
BALLOON ~~LOC~~ TREK RX 2.25X12 (BALLOONS) IMPLANT
CATH EXPO 5FR FR4 (CATHETERS) ×1 IMPLANT
CATH LAUNCHER 6FR EBU 3 (CATHETERS) ×1 IMPLANT
DEVICE RAD TR BAND REGULAR (VASCULAR PRODUCTS) ×1 IMPLANT
DRAPE BRACHIAL (DRAPES) ×1 IMPLANT
GLIDESHEATH SLEND SS 6F .021 (SHEATH) ×1 IMPLANT
GUIDEWIRE INQWIRE 1.5J.035X260 (WIRE) IMPLANT
INQWIRE 1.5J .035X260CM (WIRE) ×2
KIT ENCORE 26 ADVANTAGE (KITS) ×1 IMPLANT
PACK CARDIAC CATH (CUSTOM PROCEDURE TRAY) ×2 IMPLANT
PROTECTION STATION PRESSURIZED (MISCELLANEOUS) ×2
SET ATX SIMPLICITY (MISCELLANEOUS) ×1 IMPLANT
STATION PROTECTION PRESSURIZED (MISCELLANEOUS) IMPLANT
STENT ONYX FRONTIER 2.0X22 (Permanent Stent) ×1 IMPLANT
STENT ONYX FRONTIER 2.25X18 (Permanent Stent) ×1 IMPLANT
STENT ONYX FRONTIER 3.0X18 (Permanent Stent) ×1 IMPLANT
TUBING CIL FLEX 10 FLL-RA (TUBING) ×1 IMPLANT
WIRE ASAHI PROWATER 180CM (WIRE) ×2 IMPLANT
WIRE G HI TQ BMW 190 (WIRE) ×1 IMPLANT
WIRE RUNTHROUGH .014X180CM (WIRE) ×1 IMPLANT
WIRE RUNTHROUGH .014X300CM (WIRE) ×1 IMPLANT

## 2021-01-26 NOTE — Assessment & Plan Note (Signed)
-  Patient with substernal chest pressure that came on acutely yesterday and has been constant since -CXR unremarkable.   -Initial HS troponin elevated   -Initial EKG with ST concerns, repeat c/w STEMI -Urgent cardiology consult, CODE STEMI called -Patient has been taken to the cath lab for acute intervention. -Will admit to ICU - if she needs intubation post-procedure, ICU will assume care -Risk factor stratification with HgbA1c and FLP -Cardiology consultation requested STAT -Management per cardiology  HTN -Has not been taking prescribed Lisinopril -Likely to need beta blocker -Will defer to cardiology  HLD -Start Lipitor 80 mg daily empirically -Check lipids  DM -Check A1c -She has not been taking prescribed glucophage and glucotrol -Will cover with moderate-scale SSI for now

## 2021-01-26 NOTE — ED Provider Notes (Signed)
[facility] Provider Note    Event Date/Time   First MD Initiated Contact with Patient 01/26/21 1058     (approximate)   History   Chest Pain   HPI Elizabeth Hardin is a 61 y.o. female who presents for approximately 16 hours of chest pressure.  Patient states that this pressure began last night, is worse with exertion, partially relieved by rest, and dissimilar to any previous chest pain she has had in the past.  Patient has not tried taking any medications for this pain.  Patient states that she does have a significant family history of heart disease including a four-vessel bypass in her father, LAD occlusion in her brother, and multiple PCI's in her mother.   Physical Exam   Triage Vital Signs: ED Triage Vitals  Enc Vitals Group     BP 01/26/21 1027 (!) 104/58     Pulse Rate 01/26/21 1027 64     Resp 01/26/21 1027 16     Temp 01/26/21 1027 97.8 F (36.6 C)     Temp Source 01/26/21 1027 Oral     SpO2 01/26/21 1027 98 %     Weight 01/26/21 1023 153 lb (69.4 kg)     Height 01/26/21 1023 5\' 4"  (1.626 m)     Head Circumference --      Peak Flow --      Pain Score 01/26/21 1023 10     Pain Loc --      Pain Edu? --      Excl. in [place]? --     Most recent vital signs: Vitals:   01/26/21 1027  BP: (!) 104/58  Pulse: 64  Resp: 16  Temp: 97.8 F (36.6 C)  SpO2: 98%    General: Awake, no distress.  CV:  Good peripheral perfusion.  Resp:  Normal effort.  Abd:  No distention.  Other:  Elderly Caucasian female in no acute distress sitting in bed   ED Results / Procedures / Treatments   Labs (all labs ordered are listed, but only abnormal results are displayed) Labs Reviewed  BASIC METABOLIC PANEL - Abnormal; Notable for the following components:      Result Value   Sodium 131 (*)    Glucose, Bld 308 (*)    All other components within normal limits  CBC - Abnormal; Notable for the following components:   WBC 11.7 (*)    All other  components within normal limits  TROPONIN I (HIGH SENSITIVITY) - Abnormal; Notable for the following components:   Troponin I (High Sensitivity) 150 (*)    All other components within normal limits  MAGNESIUM  TSH     EKG ED ECG REPORT I, Naaman Plummer, the attending physician, personally viewed and interpreted this ECG.  Date: 01/26/2021 EKG Time: 1023 Rate: 84 Rhythm: A-V dissociation QRS Axis: normal Intervals: Widened QRS ST/T Wave abnormalities: Very slight ST segment elevation in 1/aVL/V2 as well as biphasic ST wave with depression in 2/lateral leads Narrative Interpretation: ST segment abnormalities globally with A-V dissociation.  Will need repeat EKG   RADIOLOGY ED MD interpretation: 2 view chest x-ray shows no evidence of acute abnormalities including no pneumonia, pneumothorax, or widened mediastinum.  Agree with radiologist read of hyperinflation with possible COPD  Official radiology report(s): DG Chest 2 View  Result Date: 01/26/2021 CLINICAL DATA:  Chest pain. EXAM: CHEST - 2 VIEW COMPARISON:  None. FINDINGS: The cardiomediastinal silhouette is within normal limits. Aortic atherosclerosis is noted.  The lungs are hyperinflated with mild coarsening of the interstitial markings. No airspace consolidation, edema, pleural effusion, or pneumothorax is identified. No acute osseous abnormality is seen. IMPRESSION: Hyperinflation/possible COPD. No evidence of acute cardiopulmonary process. Electronically Signed   By: Logan Bores M.D.   On: 01/26/2021 11:02      PROCEDURES:  Critical Care performed: Yes, see critical care procedure note(s)  .1-3 Lead EKG Interpretation Performed by: Naaman Plummer, MD Authorized by: Naaman Plummer, MD     Interpretation: abnormal     ECG rate:  74   ECG rate assessment: normal     Rhythm: sinus rhythm     Ectopy: PVCs     Conduction: normal   Comments:     ST segment elevation in 2/aVL  CRITICAL CARE Performed by: Naaman Plummer   Total critical care time: 25 minutes  Critical care time was exclusive of separately billable procedures and treating other patients.  Critical care was necessary to treat or prevent imminent or life-threatening deterioration.  Critical care was time spent personally by me on the following activities: development of treatment plan with patient and/or surrogate as well as nursing, discussions with consultants, evaluation of patient's response to treatment, examination of patient, obtaining history from patient or surrogate, ordering and performing treatments and interventions, ordering and review of laboratory studies, ordering and review of radiographic studies, pulse oximetry and re-evaluation of patient's condition.   MEDICATIONS ORDERED IN ED: Medications  nitroGLYCERIN (NITROSTAT) SL tablet 0.4 mg (has no administration in time range)  aspirin chewable tablet 324 mg (has no administration in time range)     IMPRESSION / MDM / ASSESSMENT AND PLAN / ED COURSE  I reviewed the triage vital signs and the nursing notes.                              Differential diagnosis includes, but is not limited to, STEMI, pneumothorax, pneumonia, PE  The patient is on the cardiac monitor to evaluate for evidence of arrhythmia and/or significant heart rate changes.  Pt presents via EMS with active chest pain and an EKG concerning for ST changes.  On repeat EKG after approximately 1 hour in our emergency department, patient showed definitive ST elevations in 1 and aVL consistent with a STEMI Pt received 324 mg chewable aspirin and glycerin Patient was otherwise in their normal state of health before this chest pain rapidly ensued.  Workup: CBC, BMP, troponin, T&S, PT/INR, PTT, Repeat EKGs until cath lab, CXR. Consults: Hospitalist-agrees to accept patient onto their service after calf Cardiology-Paraschos agrees to take patient to Cath Lab for further evaluation Therapy: Not hypoxic,  not requiring O2. Completed full ASA load 324mg . Anticoagulation: Pt <75yo and with suspected normal kidney function, will start Heparin bolus 60U/kg followed by 12U/kg/h gtt.  Disposition: Admit directly to Cath Lab.      FINAL CLINICAL IMPRESSION(S) / ED DIAGNOSES   Final diagnoses:  NSTEMI (non-ST elevated myocardial infarction) ([place])  Chest pain, unspecified type     Rx / DC Orders   ED Discharge Orders     None        Note:  This document was prepared using Dragon voice recognition software and may include unintentional dictation errors.   Naaman Plummer, MD 01/26/21 [phone]

## 2021-01-26 NOTE — ED Notes (Signed)
Activated Stemi to [place]  1220

## 2021-01-26 NOTE — ED Notes (Signed)
Bradler MD made aware of pts trop of 150

## 2021-01-26 NOTE — ED Triage Notes (Signed)
C/O chest pain and pressure since last night.  C/O mid chest discomfort.

## 2021-01-26 NOTE — Consult Note (Signed)
[facility]  CARDIOLOGY CONSULT NOTE  Patient ID: Elizabeth Hardin MRN: [id number] DOB/AGE: 08/02/1960 61 y.o.  Admit date: 01/26/2021 Referring Physician Lorin [facility] Primary Physician [facility] Primary Cardiologist  Reason for Consultation anterior ST elevation myocardial infarction  HPI: 61 year old female referred for evaluation of anterior STEMI.  The patient was in usual state of health until last evening when she developed midsternal chest pain described as chest heaviness and pressure thought initially to be of GI origin.  Awoke this morning with recurrent chest discomfort rated 10 out of 10.  He presented to [facility] ED where ECG revealed ST elevations in leads I, aVL and V2.  Dr. Sophronia Simas was called in consultation who felt that the EKG was diagnostic of ST elevation myocardial infarction.  The patient was taken urgently to the cardiac catheterization laboratory which revealed occluded proximal LAD, and 85% stenosis mid to distal RCA.  Patient underwent primary PCI with overlapping DES [place]/mid/distal LAD.  Left ventriculography revealed moderate reduced left ventricular function with estimated LV ejection fraction of 35% with apical dyskinesis and anterior wall akinesis.  Review of systems complete and found to be negative unless listed above     Past Medical History:  Diagnosis Date   Diabetes mellitus without complication ([place])    Hypertension    Neck pain     History reviewed. No pertinent surgical history.  Medications Prior to Admission  Medication Sig Dispense Refill Last Dose   tiZANidine (ZANAFLEX) 4 MG tablet Take 1 tablet by mouth 2 (two) times daily as needed.   Past Week at [employer]   Social History   Socioeconomic History   Marital status: Married    Spouse name: Not on file   Number of children: Not on file   Years of education: Not on file   Highest education level: Not on file  Occupational History   Occupation: packing boxes  Tobacco Use   Smoking status: Every Day     Packs/day: 1.00    Years: 45.00    Pack years: 45.00    Types: Cigarettes   Smokeless tobacco: Never  Substance and Sexual Activity   Alcohol use: Not Currently   Drug use: Never   Sexual activity: Yes  Other Topics Concern   Not on file  Social History Narrative   Not on file   Social Determinants of Health   Financial Resource Strain: Not on file  Food Insecurity: Not on file  Transportation Needs: Not on file  Physical Activity: Not on file  Stress: Not on file  Social Connections: Not on file  Intimate Partner Violence: Not on file    Family History  Problem Relation Age of Onset   CAD Mother    CAD Father    CAD Brother       Review of systems complete and found to be negative unless listed above      PHYSICAL EXAM  General: Well developed, well nourished, in no acute distress HEENT:  Normocephalic and atramatic Neck:  No JVD.  Lungs: Clear bilaterally to auscultation and percussion. Heart: HRRR . Normal S1 and S2 without gallops or murmurs.  Abdomen: Bowel sounds are positive, abdomen soft and non-tender  Msk:  Back normal, normal gait. Normal strength and tone for age. Extremities: No clubbing, cyanosis or edema.   Neuro: Alert and oriented X 3. Psych:  Good affect, responds appropriately  Labs:   Lab Results  Component Value Date   WBC 11.7 (H) 01/26/2021   HGB 14.2 01/26/2021  HCT 42.4 01/26/2021   MCV 85.0 01/26/2021   PLT 249 01/26/2021    Recent Labs  Lab 01/26/21 1037  NA 131*  K 4.0  CL 99  CO2 22  BUN 22  CREATININE 0.82  CALCIUM 9.5  GLUCOSE 308*   No results found for: CKTOTAL, CKMB, CKMBINDEX, TROPONINI No results found for: CHOL No results found for: HDL No results found for: LDLCALC No results found for: TRIG No results found for: CHOLHDL No results found for: LDLDIRECT    Radiology: DG Chest 2 View  Result Date: 01/26/2021 CLINICAL DATA:  Chest pain. EXAM: CHEST - 2 VIEW COMPARISON:  None. FINDINGS: The  cardiomediastinal silhouette is within normal limits. Aortic atherosclerosis is noted. The lungs are hyperinflated with mild coarsening of the interstitial markings. No airspace consolidation, edema, pleural effusion, or pneumothorax is identified. No acute osseous abnormality is seen. IMPRESSION: Hyperinflation/possible COPD. No evidence of acute cardiopulmonary process. Electronically Signed   By: Logan Bores M.D.   On: 01/26/2021 11:02   CARDIAC CATHETERIZATION  Result Date: 01/26/2021   Prox RCA lesion is 30% stenosed.   Mid RCA lesion is 85% stenosed.   Dist RCA lesion is 50% stenosed.   RPDA lesion is 50% stenosed.   RPAV lesion is 50% stenosed.   2nd Mrg lesion is 60% stenosed.   Mid LAD-1 lesion is 70% stenosed.   Mid LAD-2 lesion is 90% stenosed.   Prox LAD lesion is 100% stenosed.   A drug-eluting stent was successfully placed using a STENT ONYX FRONTIER 2.0X22.   A drug-eluting stent was successfully placed using a STENT ONYX FRONTIER 2.25X18.   A drug-eluting stent was successfully placed using a STENT ONYX FRONTIER 3.0X18.   Post intervention, there is a 0% residual stenosis.   Post intervention, there is a 0% residual stenosis.   Post intervention, there is a 0% residual stenosis.   There is moderate left ventricular systolic dysfunction.   The left ventricular ejection fraction is 35-45% by visual estimate. 1.  Anterior ST elevation myocardial infarction 2.  Two-vessel coronary artery disease with acute occlusion proximal LAD and 85% stenosis mid to distal RCA 3.  Moderate reduced left ventricular function with estimated LV ejection fraction 35% with apical dyskinesis and anterior wall akinesis 4.  Successful primary PCI with overlapping drug-eluting stents x3 proximal/mid/distal LAD Recommendations 1.  Dual antiplatelet therapy uninterrupted for 1 year 2.  Start metoprolol tartrate 25 mg twice daily 3.  Start atorvastatin 80 mg daily 4.  2D echocardiogram 5.  Stage PCI of distal RCA, to be  scheduled    EKG: Sinus rhythm with ST elevations leads I, aVL and V2  ASSESSMENT AND PLAN:   1.  Anterior ST elevation myocardial infarction, status post successful primary PCI with overlapping DES x3 proximal/mid/distal RCA with residual 85% stenosis mid to distal RCA, with resolution of chest pain. 2.  Ischemic cardiomyopathy, with estimated LVEF 35% with apical dyskinesis and anterior akinesis 3.  Type 2 diabetes 4.  Tobacco abuse  Recommendations  1.  Dual antiplatelet therapy uninterrupted for 1 year 2.  Start metoprolol tartrate 25 mg twice daily 3.  Start atorvastatin 80 mg daily 4.  Continue lisinopril 5.  2D echocardiogram 6.  Strongly advised patient to stop smoking  Signed: Isaias Cowman MD,PhD, [facility] 01/26/2021, 4:01 PM

## 2021-01-26 NOTE — H&P (Signed)
History and Physical    Patient: Elizabeth Hardin DOB: Jun 14, 1960 DOA: 01/26/2021 DOS: the patient was seen and examined on 01/26/2021 PCP: Cletis [place], MD  Patient coming from: Home - lives with husband; NOK: [place], [place], [phone]   Chief Complaint: Chest pain  HPI: Elizabeth Hardin is a 61 y.o. female with medical history significant of DM and chronic neck pain presenting with chest pain.  She reports that starting last night she has had a crushing substernal chest pressure.  Nothing seems to make it better or worse.  Plus associated SOB.  History was abbreviated due to urgency of situation (code stemi).  Strong FH of early CAD.    ER Course:  NSTEMI.  Ongoing CP.  Significant FH of CAD.  CP started last night, took Tums, still bad this AM.  Troponin 150.  EKG abnormal, planning to repeat.  Will consult cardiology.  Started on heparin, ASA.  Will give NTG.     Review of Systems: As mentioned in the history of present illness. All other systems reviewed and are negative. Past Medical History:  Diagnosis Date   Diabetes mellitus without complication ([place])    Hypertension    Neck pain    History reviewed. No pertinent surgical history. Social History:  reports that she has been smoking cigarettes. She has a 45.00 pack-year smoking history. She has never used smokeless tobacco. She reports that she does not currently use alcohol. She reports that she does not use drugs.  No Known Allergies  Family History  Problem Relation Age of Onset   CAD Mother    CAD Father    CAD Brother     Prior to Admission medications   Medication Sig Start Date End Date Taking? Authorizing Provider  tiZANidine (ZANAFLEX) 4 MG tablet Take 1 tablet by mouth 2 (two) times daily as needed. 06/07/19  Yes [provider]  glipiZIDE (GLUCOTROL XL) 2.5 MG 24 hr tablet Take 1 tablet by mouth daily. Patient not taking: Reported on 01/26/2021 01/27/19   [provider]  lisinopril (ZESTRIL) 5 MG tablet Take 1 tablet by mouth daily. Patient not taking: Reported on 01/26/2021 06/06/19   [provider]  metFORMIN (GLUCOPHAGE) 500 MG tablet Take 1 tablet by mouth every 12 (twelve) hours. Patient not taking: Reported on 01/26/2021 03/06/18   [provider]    Physical Exam: Vitals:   01/26/21 1130 01/26/21 1200 01/26/21 1215 01/26/21 1252  BP: 140/69 140/80 (!) 113/58   Pulse:  75 73   Resp: 13 17 (!) 22   Temp:      TempSrc:      SpO2:  98% 97% 98%  Weight:      Height:       General:  Appears calm and comfortable and is in NAD, mildly anxious, persistent CP Eyes:  PERRL, EOMI, normal lids, iris ENT:  grossly normal hearing, lips & tongue, mmm Neck:  no LAD, masses or thyromegaly Cardiovascular:  RRR, no m/r/g. No LE edema.  Respiratory:   CTA bilaterally with no wheezes/rales/rhonchi.  Normal respiratory effort. Abdomen:  soft, NT, ND Skin:  no rash or induration seen on limited exam Musculoskeletal:  grossly normal tone BUE/BLE, good ROM, no bony abnormality Psychiatric:  grossly normal mood and affect, speech fluent and appropriate, AOx3 Neurologic:  CN 2-12 grossly intact, moves all extremities in coordinated fashion   Radiological Exams on Admission: Independently reviewed - see discussion in A/P where applicable  DG Chest  2 View  Result Date: 01/26/2021 CLINICAL DATA:  Chest pain. EXAM: CHEST - 2 VIEW COMPARISON:  None. FINDINGS: The cardiomediastinal silhouette is within normal limits. Aortic atherosclerosis is noted. The lungs are hyperinflated with mild coarsening of the interstitial markings. No airspace consolidation, edema, pleural effusion, or pneumothorax is identified. No acute osseous abnormality is seen. IMPRESSION: Hyperinflation/possible COPD. No evidence of acute cardiopulmonary process. Electronically Signed   By: Logan Bores M.D.   On: 01/26/2021 11:02    EKG: Independently reviewed.   1023 -  NSR with ?junctional rhythm with rate 84; ST depressions noted diffusely 1151 - Rate 75, ST changes concerning for STEMI by my quick review - cardiology notified and asked to review at [place] on Admission: I have personally reviewed the available labs and imaging studies at the time of the admission.  Pertinent labs:    Na++ 131 Glucose 308 HS troponin 150 WBC 11.7   Assessment/Plan * STEMI (ST elevation myocardial infarction) ([place])- (present on admission) -Patient with substernal chest pressure that came on acutely yesterday and has been constant since -CXR unremarkable.   -Initial HS troponin elevated   -Initial EKG with ST concerns, repeat c/w STEMI -Urgent cardiology consult, CODE STEMI called -Patient has been taken to the cath lab for acute intervention. -Will admit to ICU - if she needs intubation post-procedure, ICU will assume care -Risk factor stratification with HgbA1c and FLP -Cardiology consultation requested STAT -Management per cardiology  HTN -Has not been taking prescribed Lisinopril -Likely to need beta blocker -Will defer to cardiology  HLD -Start Lipitor 80 mg daily empirically -Check lipids  DM -Check A1c -She has not been taking prescribed glucophage and glucotrol -Will cover with moderate-scale SSI for now  Tobacco dependence- (present on admission) -Encourage cessation.   -This was discussed with the patient and should be reviewed on an ongoing basis.   -Patch ordered at patient request.    Advance Care Planning: Full  Consults: Cardiology - CODE STEMI  Family Communication: Husband was present throughout evaluation  Severity of Illness: The appropriate patient status for this patient is INPATIENT. Inpatient status is judged to be reasonable and necessary in order to provide the required intensity of service to ensure the patient's safety. The patient's presenting symptoms, physical exam findings, and initial radiographic and  laboratory data in the context of their chronic comorbidities is felt to place them at high risk for further clinical deterioration. Furthermore, it is not anticipated that the patient will be medically stable for discharge from the hospital within 2 midnights of admission.   * I certify that at the point of admission it is my clinical judgment that the patient will require inpatient hospital care spanning beyond 2 midnights from the point of admission due to high intensity of service, high risk for further deterioration and high frequency of surveillance required.*   Total critical care time: 65 minutes Critical care time was exclusive of separately billable procedures and treating other patients. Critical care was necessary to treat or prevent imminent or life-threatening deterioration. Critical care was time spent personally by me on the following activities: development of treatment plan with patient and/or surrogate as well as nursing, discussions with consultants, evaluation of patient's response to treatment, examination of patient, obtaining history from patient or surrogate, ordering and performing treatments and interventions, ordering and review of laboratory studies, ordering and review of radiographic studies, pulse oximetry and re-evaluation of patient's condition.    Author: Karmen Bongo, MD 01/26/2021  1:42 PM  For on call review [email or web address].

## 2021-01-26 NOTE — Progress Notes (Signed)
°   01/26/21 1225  Clinical Encounter Type  Visited With Patient not available  Visit Type Code  Spiritual Encounters  Spiritual Needs Other (Comment) (not assessed)   Chaplain Burris responded to a code STEMI. Chaplain B checked-in with staff at the cath lab as well as the ED desk to determine if any family were present. Notified staff at both locations of pastoral care availability and to page if needed. No further needs assessed at this time.

## 2021-01-26 NOTE — Assessment & Plan Note (Signed)
-  Encourage cessation.   °-This was discussed with the patient and should be reviewed on an ongoing basis.   °-Patch ordered at patient request. °

## 2021-01-26 NOTE — ED Notes (Signed)
COVID swab sent down

## 2021-01-26 NOTE — Progress Notes (Signed)
ANTICOAGULATION CONSULT NOTE - Initial Consult  Pharmacy Consult for Heparin Drip Indication:  STEMI  No Known Allergies  Patient Measurements: Height: 5\' 4"  (162.6 cm) Weight: 69.4 kg (153 lb) IBW/kg (Calculated) : 54.7 Heparin Dosing Weight: 68.7 kg  Vital Signs: Temp: 97.8 F (36.6 C) (01/06 1027) Temp Source: Oral (01/06 1027) BP: 113/58 (01/06 1215) Pulse Rate: 73 (01/06 1215)  Labs: Recent Labs    01/26/21 1037 01/26/21 1216  HGB 14.2  --   HCT 42.4  --   PLT 249  --   APTT 26  --   LABPROT 12.4  --   INR 0.9  --   CREATININE 0.82  --   TROPONINIHS 150* 231*    Estimated Creatinine Clearance: 68.9 mL/min (by C-G formula based on SCr of 0.82 mg/dL).   Medical History: Past Medical History:  Diagnosis Date   Diabetes mellitus without complication ([place])     Assessment: Patient is 61yo female presenting with chest pain and pressure. Code STEMI was called. Pharmacy consulted for Heparin dosing.  No prior anticoagulants listed in PTA med list  Reviewed baseline labs.  Goal of Therapy:  Heparin level 0.3-0.7 units/ml Monitor platelets by anticoagulation protocol: Yes   Plan:  Give 4000 units bolus x 1 Start heparin infusion at 850 units/hr Check anti-Xa level in 6 hours and daily while on heparin Continue to monitor H&H and platelets  Paulina Fusi, PharmD, BCPS 01/26/2021 1:13 PM

## 2021-01-26 NOTE — Progress Notes (Signed)
Patient transferred to ICU from cath lab.  Alert and oriented, denies chest pain.  VSS and afebrile...  POC continued.

## 2021-01-27 ENCOUNTER — Inpatient Hospital Stay
Admit: 2021-01-27 | Discharge: 2021-01-27 | Disposition: A | Discharge status: Home / Self Care | Payer: 59 | Attending: Cardiology | Admitting: Cardiology

## 2021-01-27 LAB — ECHOCARDIOGRAM COMPLETE
AR max vel: 1.78 cm2
AV Peak grad: 4.8 mmHg
Ao pk vel: 1.09 m/s
Area-P 1/2: 2.82 cm2
Calc EF: 44.1 %
Height: 64 in
S' Lateral: 3.4 cm
Single Plane A2C EF: 41.5 %
Single Plane A4C EF: 46.3 %
Weight: 2278.67 oz

## 2021-01-27 LAB — CBC
HCT: 36.5 % (ref 36.0–46.0)
Hemoglobin: 12.4 g/dL (ref 12.0–15.0)
MCH: 28.2 pg (ref 26.0–34.0)
MCHC: 34 g/dL (ref 30.0–36.0)
MCV: 83 fL (ref 80.0–100.0)
Platelets: 226 10*3/uL (ref 150–400)
RBC: 4.4 MIL/uL (ref 3.87–5.11)
RDW: 13 % (ref 11.5–15.5)
WBC: 11.4 10*3/uL — ABNORMAL HIGH (ref 4.0–10.5)
nRBC: 0 % (ref 0.0–0.2)

## 2021-01-27 LAB — BASIC METABOLIC PANEL
Anion gap: 6 (ref 5–15)
BUN: 20 mg/dL (ref 8–23)
CO2: 23 mmol/L (ref 22–32)
Calcium: 8.9 mg/dL (ref 8.9–10.3)
Chloride: 104 mmol/L (ref 98–111)
Creatinine, Ser: 0.71 mg/dL (ref 0.44–1.00)
GFR, Estimated: >60 mL/min (ref >60)
Glucose, Bld: 286 mg/dL — ABNORMAL HIGH (ref 70–99)
Potassium: 4.1 mmol/L (ref 3.5–5.1)
Sodium: 133 mmol/L — ABNORMAL LOW (ref 135–145)

## 2021-01-27 LAB — HIV ANTIBODY (ROUTINE TESTING W REFLEX): HIV Screen 4th Generation wRfx: NONREACTIVE

## 2021-01-27 LAB — PROTIME-INR
INR: 1 (ref 0.8–1.2)
Prothrombin Time: 12.8 seconds (ref 11.4–15.2)

## 2021-01-27 LAB — GLUCOSE, CAPILLARY
Glucose-Capillary: 341 mg/dL — ABNORMAL HIGH (ref 70–99)
Glucose-Capillary: 128 mg/dL — ABNORMAL HIGH (ref 70–99)
Glucose-Capillary: 206 mg/dL — ABNORMAL HIGH (ref 70–99)

## 2021-01-27 MED ORDER — INSULIN ASPART 100 UNIT/ML IJ SOLN
0.0000 [IU] | Freq: Three times a day (TID) | INTRAMUSCULAR | Status: DC
  Administered 2021-01-27: 2 [IU] via SUBCUTANEOUS
  Administered 2021-01-27: 11 [IU] via SUBCUTANEOUS
  Administered 2021-01-28: 3 [IU] via SUBCUTANEOUS
  Administered 2021-01-28: 5 [IU] via SUBCUTANEOUS
  Filled 2021-01-27 (×3): qty 1

## 2021-01-27 MED ORDER — CANAGLIFLOZIN 100 MG PO TABS
100.0000 mg | ORAL_TABLET | Freq: Every day | ORAL | Status: DC
  Administered 2021-01-28: 100 mg via ORAL
  Filled 2021-01-27: qty 1

## 2021-01-27 NOTE — Progress Notes (Signed)
[facility] Cardiology  SUBJECTIVE: Patient laying in bed, denies chest pain or shortness of breath   Vitals:   01/27/21 0500 01/27/21 0600 01/27/21 0700 01/27/21 0728  BP: (!) 112/58 102/70 98/60 (P) 98/60  Pulse: (!) 59 68 (!) 59 72  Resp: 16 19 16  (!) 21  Temp:    (P) 98.6 F (37 C)  TempSrc:    (P) Oral  SpO2: 98% 98% 95% 97%  Weight:      Height:         Intake/Output Summary (Last 24 hours) at 01/27/2021 1008 Last data filed at 01/27/2021 3559 Gross per 24 hour  Intake 282.73 ml  Output 400 ml  Net -117.27 ml      PHYSICAL EXAM  General: Well developed, well nourished, in no acute distress HEENT:  Normocephalic and atramatic Neck:  No JVD.  Lungs: Clear bilaterally to auscultation and percussion. Heart: HRRR . Normal S1 and S2 without gallops or murmurs.  Abdomen: Bowel sounds are positive, abdomen soft and non-tender  Msk:  Back normal, normal gait. Normal strength and tone for age. Extremities: No clubbing, cyanosis or edema.   Neuro: Alert and oriented X 3. Psych:  Good affect, responds appropriately   LABS: Basic Metabolic Panel: Recent Labs    01/26/21 1025 01/26/21 1037 01/27/21 0423  NA  --  131* 133*  K  --  4.0 4.1  CL  --  99 104  CO2  --  22 23  GLUCOSE  --  308* 286*  BUN  --  22 20  CREATININE  --  0.82 0.71  CALCIUM  --  9.5 8.9  MG 1.9  --   --    Liver Function Tests: No results for input(s): AST, ALT, ALKPHOS, BILITOT, PROT, ALBUMIN in the last 72 hours. No results for input(s): LIPASE, AMYLASE in the last 72 hours. CBC: Recent Labs    01/26/21 1037 01/27/21 0423  WBC 11.7* 11.4*  HGB 14.2 12.4  HCT 42.4 36.5  MCV 85.0 83.0  PLT 249 226   Cardiac Enzymes: No results for input(s): CKTOTAL, CKMB, CKMBINDEX, TROPONINI in the last 72 hours. BNP: Invalid input(s): POCBNP D-Dimer: No results for input(s): DDIMER in the last 72 hours. Hemoglobin A1C: Recent Labs    01/26/21 1216  HGBA1C 9.7*   Fasting Lipid Panel: Recent Labs     01/26/21 1216  CHOL 167  HDL 57  LDLCALC 99  TRIG 57  CHOLHDL 2.9   Thyroid Function Tests: Recent Labs    01/26/21 1025  TSH 1.470   Anemia Panel: No results for input(s): VITAMINB12, FOLATE, FERRITIN, TIBC, IRON, RETICCTPCT in the last 72 hours.  DG Chest 2 View  Result Date: 01/26/2021 CLINICAL DATA:  Chest pain. EXAM: CHEST - 2 VIEW COMPARISON:  None. FINDINGS: The cardiomediastinal silhouette is within normal limits. Aortic atherosclerosis is noted. The lungs are hyperinflated with mild coarsening of the interstitial markings. No airspace consolidation, edema, pleural effusion, or pneumothorax is identified. No acute osseous abnormality is seen. IMPRESSION: Hyperinflation/possible COPD. No evidence of acute cardiopulmonary process. Electronically Signed   By: Sebastian Ache M.D.   On: 01/26/2021 11:02   CARDIAC CATHETERIZATION  Result Date: 01/26/2021   Prox RCA lesion is 30% stenosed.   Mid RCA lesion is 85% stenosed.   Dist RCA lesion is 50% stenosed.   RPDA lesion is 50% stenosed.   RPAV lesion is 50% stenosed.   2nd Mrg lesion is 60% stenosed.   Mid LAD-1 lesion  is 70% stenosed.   Mid LAD-2 lesion is 90% stenosed.   Prox LAD lesion is 100% stenosed.   A drug-eluting stent was successfully placed using a STENT ONYX FRONTIER 2.0X22.   A drug-eluting stent was successfully placed using a STENT ONYX FRONTIER 2.25X18.   A drug-eluting stent was successfully placed using a STENT ONYX FRONTIER 3.0X18.   Post intervention, there is a 0% residual stenosis.   Post intervention, there is a 0% residual stenosis.   Post intervention, there is a 0% residual stenosis.   There is moderate left ventricular systolic dysfunction.   The left ventricular ejection fraction is 35-45% by visual estimate. 1.  Anterior ST elevation myocardial infarction 2.  Two-vessel coronary artery disease with acute occlusion proximal LAD and 85% stenosis mid to distal RCA 3.  Moderate reduced left ventricular function with  estimated LV ejection fraction 35% with apical dyskinesis and anterior wall akinesis 4.  Successful primary PCI with overlapping drug-eluting stents x3 proximal/mid/distal LAD Recommendations 1.  Dual antiplatelet therapy uninterrupted for 1 year 2.  Start metoprolol tartrate 25 mg twice daily 3.  Start atorvastatin 80 mg daily 4.  2D echocardiogram 5.  Stage PCI of distal RCA, to be scheduled     Echo pending  TELEMETRY: Sinus rhythm:  ASSESSMENT AND PLAN:  Principal Problem:   STEMI (ST elevation myocardial infarction) ([place]) Active Problems:   Diabetes mellitus ([place])   Essential hypertension   Tobacco dependence    1. Anterior ST elevation myocardial infarction, status post successful primary PCI with overlapping DES x3 proximal/mid/distal LAD with residual 85% stenosis mid to distal RCA, with resolution of chest pain. 2.  Ischemic cardiomyopathy, with estimated LVEF 35-40% by left ventriculography with apical dyskinesis and anterior akinesis 3.  Type 2 diabetes 4.  Tobacco abuse   Recommendations   1.  Dual antiplatelet therapy uninterrupted for 1 year 2.  Continue metoprolol tartrate 25 mg twice daily 3.  Continue atorvastatin 80 mg daily 4.  Continue home lisinopril 5.  Review 2D echocardiogram 6.  Strongly advised patient to stop smoking 7.  Pending 2D echocardiogram results may consider discharge on 01/28/2021 with plan for staged PCI of RCA later date   Isaias Cowman, MD, PhD, [facility] 01/27/2021 10:08 AM

## 2021-01-27 NOTE — Progress Notes (Signed)
*  PRELIMINARY RESULTS* Echocardiogram 2D Echocardiogram has been performed.  Elizabeth Hardin 01/27/2021, 11:48 AM

## 2021-01-27 NOTE — Progress Notes (Signed)
PT Cancellation Note  Patient Details Name: Elizabeth Hardin MRN: [id number] DOB: September 18, 1960   Cancelled Treatment:    Reason Eval/Treat Not Completed: PT screened, no needs identified, will sign off PT orders received, chart reviewed. Nurse & pt both report pt has been ambulating around unit without assistance. Pt denies any concerns re: functional mobility. PT to sign off at this time. Please re-consult if new needs arise.  Aleda Grana, PT, DPT 01/27/21, 2:12 PM    Sandi Mariscal 01/27/2021, 2:12 PM

## 2021-01-27 NOTE — Progress Notes (Addendum)
Pt BP at 99/60 MAP 70 but not on distress. Pt is on scheduled 25 mg metropolol tablet at 2200. NP Barry Brunner. Will continue to monitor.  Update 2054: NP Jon Billings ordered to hold metropolol at this time. Will continue to monitor.  Update 2358: Pt have an order for EKG at 0000, 0300 and 0600 STAT. NP Jon Billings made aware if the pt needs to get EKG every 3 hours. Will continue to monitor.  Update 0027: NP Jon Billings discontinue EKG. Will continue to monitor.

## 2021-01-27 NOTE — Progress Notes (Addendum)
PROGRESS NOTE    Erma PintoWendy L Roethler  ZOX:[id number] DOB: 09/12/1960 DOA: 01/26/2021 PCP: Corky DownsMasoud, Javed, MD  Outpatient Specialists: none    Brief Narrative:   Presented 1/6 with several days substernal chest pressure, found to have NSTEMI. Cath 1/6 with 2-vessel disease with PCI x3 to LAD.   Assessment & Plan:   Principal Problem:   STEMI (ST elevation myocardial infarction) ([facility]) Active Problems:   Diabetes mellitus ([facility])   Essential hypertension   Tobacco dependence  # CAD  # NSTEMI # HFrEF Pci 1/6 with overlapping DES x3 to LAD. Plan for staged PCI to RCA in subsequent weeks. Chest pain resolved. BP soft but hemodynamically stable. EF 35% at time of cath. - continue dapt plan for 1 year - cont metop - add jardiance - cont statin - PT consult - TTE pending - likely d/c tomorrow - outpt cardio rehab - smoking cessation, better dm control  # T2DM Uncontrolled, a1c 9.7, not on home meds - SSI - start invokana - close pcp f/u  # Tobacco abuse - smoking cessation counseling provided - patch   DVT prophylaxis: lovenox Code Status: full Family Communication: none @ bedside  Level of care: Telemetry Medical Status is: Inpatient  Remains inpatient appropriate because: severity of illness        Consultants:  cardiology  Procedures: LHC  Antimicrobials:  none    Subjective: Chest pain/pressure resolved  Objective: Vitals:   01/27/21 0500 01/27/21 0600 01/27/21 0700 01/27/21 0728  BP: (!) 112/58 102/70 98/60 (P) 98/60  Pulse: (!) 59 68 (!) 59 72  Resp: 16 19 16  (!) 21  Temp:    (P) 98.6 F (37 C)  TempSrc:    (P) Oral  SpO2: 98% 98% 95% 97%  Weight:      Height:        Intake/Output Summary (Last 24 hours) at 01/27/2021 0925 Last data filed at 01/27/2021 0728 Gross per 24 hour  Intake 282.73 ml  Output 400 ml  Net -117.27 ml   Filed Weights   01/26/21 1023 01/26/21 1557  Weight: 69.4 kg 64.6 kg    Examination:  General exam:  Appears calm and comfortable  Respiratory system: Clear to auscultation. Respiratory effort normal. Cardiovascular system: S1 & S2 heard, RRR. No JVD, murmurs, rubs, gallops or clicks. No pedal edema. Gastrointestinal system: Abdomen is nondistended, soft and nontender. No organomegaly or masses felt. Normal bowel sounds heard. Central nervous system: Alert and oriented. No focal neurological deficits. Extremities: Symmetric 5 x 5 power. Skin: No rashes, lesions or ulcers Psychiatry: Judgement and insight appear normal. Mood & affect appropriate.     Data Reviewed: I have personally reviewed following labs and imaging studies  CBC: Recent Labs  Lab 01/26/21 1037 01/27/21 0423  WBC 11.7* 11.4*  HGB 14.2 12.4  HCT 42.4 36.5  MCV 85.0 83.0  PLT 249 226   Basic Metabolic Panel: Recent Labs  Lab 01/26/21 1025 01/26/21 1037 01/27/21 0423  NA  --  131* 133*  K  --  4.0 4.1  CL  --  99 104  CO2  --  22 23  GLUCOSE  --  308* 286*  BUN  --  22 20  CREATININE  --  0.82 0.71  CALCIUM  --  9.5 8.9  MG 1.9  --   --    GFR: Estimated Creatinine Clearance: 63.8 mL/min (by C-G formula based on SCr of 0.71 mg/dL). Liver Function Tests: No results for input(s): AST, ALT,  ALKPHOS, BILITOT, PROT, ALBUMIN in the last 168 hours. No results for input(s): LIPASE, AMYLASE in the last 168 hours. No results for input(s): AMMONIA in the last 168 hours. Coagulation Profile: Recent Labs  Lab 01/26/21 1037 01/27/21 0423  INR 0.9 1.0   Cardiac Enzymes: No results for input(s): CKTOTAL, CKMB, CKMBINDEX, TROPONINI in the last 168 hours. BNP (last 3 results) No results for input(s): PROBNP in the last 8760 hours. HbA1C: Recent Labs    01/26/21 1216  HGBA1C 9.7*   CBG: No results for input(s): GLUCAP in the last 168 hours. Lipid Profile: Recent Labs    01/26/21 1216  CHOL 167  HDL 57  LDLCALC 99  TRIG 57  CHOLHDL 2.9   Thyroid Function Tests: Recent Labs    01/26/21 1025   TSH 1.470   Anemia Panel: No results for input(s): VITAMINB12, FOLATE, FERRITIN, TIBC, IRON, RETICCTPCT in the last 72 hours. Urine analysis:    Component Value Date/Time   COLORURINE YELLOW (A) 11/23/2020 1121   APPEARANCEUR HAZY (A) 11/23/2020 1121   LABSPEC 1.023 11/23/2020 1121   PHURINE 5.0 11/23/2020 1121   GLUCOSEU >=500 (A) 11/23/2020 1121   HGBUR SMALL (A) 11/23/2020 1121   BILIRUBINUR NEGATIVE 11/23/2020 1121   KETONESUR 20 (A) 11/23/2020 1121   PROTEINUR NEGATIVE 11/23/2020 1121   NITRITE NEGATIVE 11/23/2020 1121   LEUKOCYTESUR NEGATIVE 11/23/2020 1121   Sepsis Labs: @LABRCNTIP (procalcitonin:4,lacticidven:4)  ) Recent Results (from the past 240 hour(s))  Resp Panel by RT-PCR (Flu A&B, Covid) Nasopharyngeal Swab     Status: None   Collection Time: 01/26/21 10:39 AM   Specimen: Nasopharyngeal Swab; Nasopharyngeal(NP) swabs in vial transport medium  Result Value Ref Range Status   SARS Coronavirus 2 by RT PCR NEGATIVE NEGATIVE Final    Comment: (NOTE) SARS-CoV-2 target nucleic acids are NOT DETECTED.  The SARS-CoV-2 RNA is generally detectable in upper respiratory specimens during the acute phase of infection. The lowest concentration of SARS-CoV-2 viral copies this assay can detect is 138 copies/mL. A negative result does not preclude SARS-Cov-2 infection and should not be used as the sole basis for treatment or other patient management decisions. A negative result may occur with  improper specimen collection/handling, submission of specimen other than nasopharyngeal swab, presence of viral mutation(s) within the areas targeted by this assay, and inadequate number of viral copies(<138 copies/mL). A negative result must be combined with clinical observations, patient history, and epidemiological information. The expected result is Negative.  Fact Sheet for Patients:  03/26/21  Fact Sheet for Healthcare Providers:   BloggerCourse.com  This test is no t yet approved or cleared by the [employer] FDA and  has been authorized for detection and/or diagnosis of SARS-CoV-2 by FDA under an Emergency Use Authorization (EUA). This EUA will remain  in effect (meaning this test can be used) for the duration of the COVID-19 declaration under Section 564(b)(1) of the Act, 21 U.S.C.section 360bbb-3(b)(1), unless the authorization is terminated  or revoked sooner.       Influenza A by PCR NEGATIVE NEGATIVE Final   Influenza B by PCR NEGATIVE NEGATIVE Final    Comment: (NOTE) The Xpert Xpress SARS-CoV-2/FLU/RSV plus assay is intended as an aid in the diagnosis of influenza from Nasopharyngeal swab specimens and should not be used as a sole basis for treatment. Nasal washings and aspirates are unacceptable for Xpert Xpress SARS-CoV-2/FLU/RSV testing.  Fact Sheet for Patients: [place]  Fact Sheet for Healthcare Providers: BloggerCourse.com  This test is not yet approved  or cleared by the [place] and has been authorized for detection and/or diagnosis of SARS-CoV-2 by FDA under an Emergency Use Authorization (EUA). This EUA will remain in effect (meaning this test can be used) for the duration of the COVID-19 declaration under Section 564(b)(1) of the Act, 21 U.S.C. section 360bbb-3(b)(1), unless the authorization is terminated or revoked.  Performed at [facility], [address] [place], [place] [address]          Radiology Studies: DG Chest 2 View  Result Date: 01/26/2021 CLINICAL DATA:  Chest pain. EXAM: CHEST - 2 VIEW COMPARISON:  None. FINDINGS: The cardiomediastinal silhouette is within normal limits. Aortic atherosclerosis is noted. The lungs are hyperinflated with mild coarsening of the interstitial markings. No airspace consolidation, edema, pleural effusion, or pneumothorax is  identified. No acute osseous abnormality is seen. IMPRESSION: Hyperinflation/possible COPD. No evidence of acute cardiopulmonary process. Electronically Signed   By: Sebastian Ache M.D.   On: 01/26/2021 11:02   CARDIAC CATHETERIZATION  Result Date: 01/26/2021   Prox RCA lesion is 30% stenosed.   Mid RCA lesion is 85% stenosed.   Dist RCA lesion is 50% stenosed.   RPDA lesion is 50% stenosed.   RPAV lesion is 50% stenosed.   2nd Mrg lesion is 60% stenosed.   Mid LAD-1 lesion is 70% stenosed.   Mid LAD-2 lesion is 90% stenosed.   Prox LAD lesion is 100% stenosed.   A drug-eluting stent was successfully placed using a STENT ONYX FRONTIER 2.0X22.   A drug-eluting stent was successfully placed using a STENT ONYX FRONTIER 2.25X18.   A drug-eluting stent was successfully placed using a STENT ONYX FRONTIER 3.0X18.   Post intervention, there is a 0% residual stenosis.   Post intervention, there is a 0% residual stenosis.   Post intervention, there is a 0% residual stenosis.   There is moderate left ventricular systolic dysfunction.   The left ventricular ejection fraction is 35-45% by visual estimate. 1.  Anterior ST elevation myocardial infarction 2.  Two-vessel coronary artery disease with acute occlusion proximal LAD and 85% stenosis mid to distal RCA 3.  Moderate reduced left ventricular function with estimated LV ejection fraction 35% with apical dyskinesis and anterior wall akinesis 4.  Successful primary PCI with overlapping drug-eluting stents x3 proximal/mid/distal LAD Recommendations 1.  Dual antiplatelet therapy uninterrupted for 1 year 2.  Start metoprolol tartrate 25 mg twice daily 3.  Start atorvastatin 80 mg daily 4.  2D echocardiogram 5.  Stage PCI of distal RCA, to be scheduled        Scheduled Meds:  aspirin  81 mg Oral Daily   atorvastatin  80 mg Oral Daily   Chlorhexidine Gluconate Cloth  6 each Topical Q0600   influenza vac split quadrivalent PF  0.5 mL Intramuscular Tomorrow-1000    metoprolol tartrate  25 mg Oral BID   nicotine  14 mg Transdermal Daily   sodium chloride flush  3 mL Intravenous Q12H   sodium chloride flush  3 mL Intravenous Q12H   ticagrelor  90 mg Oral BID   Continuous Infusions:  sodium chloride     sodium chloride       LOS: 1 day    Time spent: 40 min    Silvano Bilis, MD [facility]   If 7PM-7AM, please contact night-coverage [email or web address] Password [facility] 01/27/2021, 9:25 AM

## 2021-01-27 NOTE — Plan of Care (Signed)
°  Problem: Activity: Goal: Ability to return to baseline activity level will improve Outcome: Progressing   Problem: Cardiovascular: Goal: Ability to achieve and maintain adequate cardiovascular perfusion will improve Outcome: Progressing   Problem: Cardiovascular: Goal: Vascular access site(s) Level 0-1 will be maintained Outcome: Progressing   Problem: Education: Goal: Knowledge of General Education information will improve Description: Including pain rating scale, medication(s)/side effects and non-pharmacologic comfort measures Outcome: Progressing   Problem: Pain Managment: Goal: General experience of comfort will improve Outcome: Progressing

## 2021-01-28 LAB — GLUCOSE, CAPILLARY
Glucose-Capillary: 180 mg/dL — ABNORMAL HIGH (ref 70–99)
Glucose-Capillary: 223 mg/dL — ABNORMAL HIGH (ref 70–99)

## 2021-01-28 LAB — BASIC METABOLIC PANEL
Anion gap: 6 (ref 5–15)
BUN: 19 mg/dL (ref 8–23)
CO2: 26 mmol/L (ref 22–32)
Calcium: 8.9 mg/dL (ref 8.9–10.3)
Chloride: 104 mmol/L (ref 98–111)
Creatinine, Ser: 0.87 mg/dL (ref 0.44–1.00)
GFR, Estimated: >60 mL/min (ref >60)
Glucose, Bld: 261 mg/dL — ABNORMAL HIGH (ref 70–99)
Potassium: 4.3 mmol/L (ref 3.5–5.1)
Sodium: 136 mmol/L (ref 135–145)

## 2021-01-28 MED ORDER — ATORVASTATIN CALCIUM 80 MG PO TABS: 80.0000 mg | ORAL_TABLET | Freq: Every day | ORAL | 1 refills | Status: DC

## 2021-01-28 MED ORDER — CANAGLIFLOZIN 100 MG PO TABS: 100.0000 mg | ORAL_TABLET | Freq: Every day | ORAL | 1 refills | Status: DC

## 2021-01-28 MED ORDER — TICAGRELOR 90 MG PO TABS: 90.0000 mg | ORAL_TABLET | Freq: Two times a day (BID) | ORAL | 1 refills | Status: AC

## 2021-01-28 MED ORDER — NICOTINE POLACRILEX 2 MG MT GUM: 2.0000 mg | CHEWING_GUM | OROMUCOSAL | 0 refills | Status: DC | PRN

## 2021-01-28 MED ORDER — NICOTINE 14 MG/24HR TD PT24: 14.0000 mg | MEDICATED_PATCH | Freq: Every day | TRANSDERMAL | 0 refills | Status: DC

## 2021-01-28 MED ORDER — ASPIRIN 81 MG PO CHEW: 81.0000 mg | CHEWABLE_TABLET | Freq: Every day | ORAL | 1 refills | Status: AC

## 2021-01-28 MED ORDER — METOPROLOL TARTRATE 25 MG PO TABS: 25.0000 mg | ORAL_TABLET | Freq: Two times a day (BID) | ORAL | 1 refills | Status: AC

## 2021-01-28 MED ORDER — NITROGLYCERIN 0.4 MG SL SUBL: 0.4000 mg | SUBLINGUAL_TABLET | SUBLINGUAL | 12 refills | Status: DC | PRN

## 2021-01-28 NOTE — Progress Notes (Signed)
[facility]  SUBJECTIVE: Patient sitting in chair, reports doing well, denies chest pain or shortness of breath   Vitals:   01/27/21 1957 01/27/21 2309 01/28/21 0354 01/28/21 0755  BP: 99/60 (!) 93/57 (!) 101/59 93/62  Pulse: 65 65 64 73  Resp: 20 16 18 20   Temp: 97.6 F (36.4 C) 98 F (36.7 C) 98 F (36.7 C) 98.7 F (37.1 C)  TempSrc:      SpO2: 98% 96% 97% 100%  Weight:      Height:         Intake/Output Summary (Last 24 hours) at 01/28/2021 1106 Last data filed at 01/28/2021 1018 Gross per 24 hour  Intake 720 ml  Output --  Net 720 ml      PHYSICAL EXAM  General: Well developed, well nourished, in no acute distress HEENT:  Normocephalic and atramatic Neck:  No JVD.  Lungs: Clear bilaterally to auscultation and percussion. Heart: HRRR . Normal S1 and S2 without gallops or murmurs.  Abdomen: Bowel sounds are positive, abdomen soft and non-tender  Msk:  Back normal, normal gait. Normal strength and tone for age. Extremities: No clubbing, cyanosis or edema.   Neuro: Alert and oriented X 3. Psych:  Good affect, responds appropriately   LABS: Basic Metabolic Panel: Recent Labs    01/26/21 1025 01/26/21 1037 01/27/21 0423 01/28/21 0536  NA  --    < > 133* 136  K  --    < > 4.1 4.3  CL  --    < > 104 104  CO2  --    < > 23 26  GLUCOSE  --    < > 286* 261*  BUN  --    < > 20 19  CREATININE  --    < > 0.71 0.87  CALCIUM  --    < > 8.9 8.9  MG 1.9  --   --   --    < > = values in this interval not displayed.   Liver Function Tests: No results for input(s): AST, ALT, ALKPHOS, BILITOT, PROT, ALBUMIN in the last 72 hours. No results for input(s): LIPASE, AMYLASE in the last 72 hours. CBC: Recent Labs    01/26/21 1037 01/27/21 0423  WBC 11.7* 11.4*  HGB 14.2 12.4  HCT 42.4 36.5  MCV 85.0 83.0  PLT 249 226   Cardiac Enzymes: No results for input(s): CKTOTAL, CKMB, CKMBINDEX, TROPONINI in the last 72 hours. BNP: Invalid input(s): POCBNP D-Dimer: No  results for input(s): DDIMER in the last 72 hours. Hemoglobin A1C: Recent Labs    01/26/21 1216  HGBA1C 9.7*   Fasting Lipid Panel: Recent Labs    01/26/21 1216  CHOL 167  HDL 57  LDLCALC 99  TRIG 57  CHOLHDL 2.9   Thyroid Function Tests: Recent Labs    01/26/21 1025  TSH 1.470   Anemia Panel: No results for input(s): VITAMINB12, FOLATE, FERRITIN, TIBC, IRON, RETICCTPCT in the last 72 hours.  CARDIAC CATHETERIZATION  Result Date: 01/26/2021   Prox RCA lesion is 30% stenosed.   Mid RCA lesion is 85% stenosed.   Dist RCA lesion is 50% stenosed.   RPDA lesion is 50% stenosed.   RPAV lesion is 50% stenosed.   2nd Mrg lesion is 60% stenosed.   Mid LAD-1 lesion is 70% stenosed.   Mid LAD-2 lesion is 90% stenosed.   Prox LAD lesion is 100% stenosed.   A drug-eluting stent was successfully placed using a STENT ONYX  FRONTIER 2.0X22.   A drug-eluting stent was successfully placed using a STENT ONYX FRONTIER 2.25X18.   A drug-eluting stent was successfully placed using a STENT ONYX FRONTIER 3.0X18.   Post intervention, there is a 0% residual stenosis.   Post intervention, there is a 0% residual stenosis.   Post intervention, there is a 0% residual stenosis.   There is moderate left ventricular systolic dysfunction.   The left ventricular ejection fraction is 35-45% by visual estimate. 1.  Anterior ST elevation myocardial infarction 2.  Two-vessel coronary artery disease with acute occlusion proximal LAD and 85% stenosis mid to distal RCA 3.  Moderate reduced left ventricular function with estimated LV ejection fraction 35% with apical dyskinesis and anterior wall akinesis 4.  Successful primary PCI with overlapping drug-eluting stents x3 proximal/mid/distal LAD Recommendations 1.  Dual antiplatelet therapy uninterrupted for 1 year 2.  Start metoprolol tartrate 25 mg twice daily 3.  Start atorvastatin 80 mg daily 4.  2D echocardiogram 5.  Stage PCI of distal RCA, to be scheduled   ECHOCARDIOGRAM  COMPLETE  Result Date: 01/27/2021    ECHOCARDIOGRAM REPORT   Patient Name:   Elizabeth Hardin Date of Exam: 01/27/2021 Medical Rec #:  [id number]         Height:       64.0 in Accession #:    [id number]        Weight:       142.4 lb Date of Birth:  03-20-1960         BSA:          1.693 m Patient Age:    60 years          BP:           102/70 mmHg Patient Gender: F                 HR:           66 bpm. Exam Location:  [facility] Procedure: 2D Echo Indications:     Acute Myocardial Infarction I121.9  History:         Patient has no prior history of Echocardiogram examinations.  Sonographer:     Overton Mamikeshia Johnson RDCS Referring Phys:  [id number] Lyn HollingsheadALEXANDER Chaka Boyson Diagnosing Phys: Marcina MillardAlexander Sergio Hobart MD IMPRESSIONS  1. Left ventricular ejection fraction, by estimation, is 40 to 45%. The left ventricle has mildly decreased function. The left ventricle demonstrates regional wall motion abnormalities (see scoring diagram/findings for description). Left ventricular diastolic parameters were normal.  2. Right ventricular systolic function is normal. The right ventricular size is normal.  3. The mitral valve is normal in structure. Trivial mitral valve regurgitation. No evidence of mitral stenosis.  4. The aortic valve is normal in structure. Aortic valve regurgitation is not visualized. No aortic stenosis is present.  5. The inferior vena cava is normal in size with greater than 50% respiratory variability, suggesting right atrial pressure of 3 mmHg. FINDINGS  Left Ventricle: Left ventricular ejection fraction, by estimation, is 40 to 45%. The left ventricle has mildly decreased function. The left ventricle demonstrates regional wall motion abnormalities. The left ventricular internal cavity size was normal in size. There is no left ventricular hypertrophy. Left ventricular diastolic parameters were normal.  LV Wall Scoring: The basal anteroseptal segment, apical lateral segment, apical septal segment, basal inferoseptal segment, and  apex are akinetic. Right Ventricle: The right ventricular size is normal. No increase in right ventricular wall thickness. Right ventricular systolic function is normal. Left Atrium: Left  atrial size was normal in size. Right Atrium: Right atrial size was normal in size. Pericardium: There is no evidence of pericardial effusion. Mitral Valve: The mitral valve is normal in structure. Trivial mitral valve regurgitation. No evidence of mitral valve stenosis. Tricuspid Valve: The tricuspid valve is normal in structure. Tricuspid valve regurgitation is trivial. No evidence of tricuspid stenosis. Aortic Valve: The aortic valve is normal in structure. Aortic valve regurgitation is not visualized. No aortic stenosis is present. Aortic valve peak gradient measures 4.8 mmHg. Pulmonic Valve: The pulmonic valve was normal in structure. Pulmonic valve regurgitation is not visualized. No evidence of pulmonic stenosis. Aorta: The aortic root is normal in size and structure. Venous: The inferior vena cava is normal in size with greater than 50% respiratory variability, suggesting right atrial pressure of 3 mmHg. IAS/Shunts: No atrial level shunt detected by color flow Doppler.  LEFT VENTRICLE PLAX 2D LVIDd:         4.50 cm     Diastology LVIDs:         3.40 cm     LV e' medial:    5.66 cm/s LV PW:         1.00 cm     LV E/e' medial:  13.5 LV IVS:        1.00 cm     LV e' lateral:   5.66 cm/s LVOT diam:     1.80 cm     LV E/e' lateral: 13.5 LV SV:         48 LV SV Index:   29 LVOT Area:     2.54 cm  LV Volumes (MOD) LV vol d, MOD A2C: 69.7 ml LV vol d, MOD A4C: 72.1 ml LV vol s, MOD A2C: 40.8 ml LV vol s, MOD A4C: 38.7 ml LV SV MOD A2C:     28.9 ml LV SV MOD A4C:     72.1 ml LV SV MOD BP:      31.6 ml RIGHT VENTRICLE RV Basal diam:  2.20 cm RV S prime:     10.10 cm/s TAPSE (M-mode): 1.8 cm LEFT ATRIUM             Index        RIGHT ATRIUM           Index LA diam:        3.30 cm 1.95 cm/m   RA Area:     11.60 cm LA Vol (A2C):    63.8 ml 37.67 ml/m  RA Volume:   26.60 ml  15.71 ml/m LA Vol (A4C):   30.9 ml 18.25 ml/m LA Biplane Vol: 45.0 ml 26.57 ml/m  AORTIC VALVE                 PULMONIC VALVE AV Area (Vmax): 1.78 cm     PV Vmax:       0.66 m/s AV Vmax:        109.00 cm/s  PV Peak grad:  1.8 mmHg AV Peak Grad:   4.8 mmHg LVOT Vmax:      76.40 cm/s LVOT Vmean:     52.400 cm/s LVOT VTI:       0.190 m  AORTA Ao Root diam: 3.10 cm Ao Asc diam:  2.70 cm MITRAL VALVE MV Area (PHT): 2.82 cm    SHUNTS MV Decel Time: 269 msec    Systemic VTI:  0.19 m MV E velocity: 76.30 cm/s  Systemic Diam: 1.80 cm MV A velocity: 64.70 cm/s  MV E/A ratio:  1.18 Marcina Millard MD Electronically signed by Marcina Millard MD Signature Date/Time: 01/27/2021/1:14:01 PM    Final      Echo LVEF 40 to 45%  TELEMETRY: Sinus rhythm:  ASSESSMENT AND PLAN:  Principal Problem:   STEMI (ST elevation myocardial infarction) (HCC) Active Problems:   Diabetes mellitus (HCC)   Essential hypertension   Tobacco dependence    1.  Anterior ST elevation myocardial infarction, status post successful primary PCI with overlapping DES x3 proximal/mid/distal LAD with residual 85% stenosis mid to distal RCA, with resolution of chest pain. 2.  Ischemic cardiomyopathy, with estimated LVEF 40 to 45% x 2 D echocardiogram 3.  Type 2 diabetes 4.  Tobacco abuse   Recommendations   1.  Dual antiplatelet therapy uninterrupted for 1 year 2.  Continue metoprolol tartrate 25 mg twice daily 3.  Continue atorvastatin 80 mg daily 4.  Continue home lisinopril 5.  Strongly advised patient to stop smoking 6.  May discharge home, follow-up with me in 1 week, will schedule staged PCI of RCA in 1 to 2 weeks   Marcina Millard, MD, PhD, [facility] 01/28/2021 11:06 AM

## 2021-01-28 NOTE — Discharge Summary (Signed)
Elizabeth Hardin [id number] DOB: 24-Aug-1960 DOA: 01/26/2021  PCP: Cletis Athens, MD  Admit date: 01/26/2021 Discharge date: 01/28/2021  Time spent: 35 minutes  Recommendations for Outpatient Follow-up:  Close cardiology and pcp f/u     Discharge Diagnoses:  Principal Problem:   STEMI (ST elevation myocardial infarction) ([place]) Active Problems:   Diabetes mellitus ([place])   Essential hypertension   Tobacco dependence   Discharge Condition: stable  Diet recommendation: heart healthy  Filed Weights   01/26/21 1023 01/26/21 1557  Weight: 69.4 kg 64.6 kg    History of present illness:  Elizabeth Hardin is a 61 y.o. female with medical history significant of DM and chronic neck pain presenting with chest pain.  She reports that starting last night she has had a crushing substernal chest pressure.  Nothing seems to make it better or worse.  Plus associated SOB.  History was abbreviated due to urgency of situation (code stemi).  Strong FH of early CAD.  Hospital Course:  # CAD  # NSTEMI # HFrEF Pci 1/6 with overlapping DES x3 to LAD. Plan for staged PCI to RCA in subsequent weeks. Chest pain resolved. BP soft but hemodynamically stable. EF 35% at time of cath improved to 40-45 on TTE after - continue dapt plan for 1 year - cont metop - add jardiance - cont statin - cardiac rehab referral - TTE pending - cardiology f/u 1 wk - smoking cessation, better dm control   # T2DM Uncontrolled, a1c 9.7, not on home meds - start invokana - close pcp f/u   # Tobacco abuse - smoking cessation counseling provided - patch/gum ordered  Procedures: Left heart cath   Consultations: cardiology  Discharge Exam: Vitals:   01/28/21 0755 01/28/21 1117  BP: 93/62 94/63  Pulse: 73 65  Resp: 20 19  Temp: 98.7 F (37.1 C) 98.2 F (36.8 C)  SpO2: 100% 100%   General exam: Appears calm and comfortable  Respiratory system: Clear to auscultation. Respiratory effort  normal. Cardiovascular system: S1 & S2 heard, RRR. No JVD, murmurs, rubs, gallops or clicks. No pedal edema. Gastrointestinal system: Abdomen is nondistended, soft and nontender. No organomegaly or masses felt. Normal bowel sounds heard. Central nervous system: Alert and oriented. No focal neurological deficits. Extremities: Symmetric 5 x 5 power. Skin: No rashes, lesions or ulcers Psychiatry: Judgement and insight appear normal. Mood & affect appropriate.   Discharge Instructions   Discharge Instructions     AMB Referral to Cardiac Rehabilitation - Phase II   Complete by: As directed    Diagnosis: STEMI   After initial evaluation and assessments completed: Virtual Based Care may be provided alone or in conjunction with Phase 2 Cardiac Rehab based on patient barriers.: Yes   Diet - low sodium heart healthy   Complete by: As directed    Increase activity slowly   Complete by: As directed       Allergies as of 01/28/2021   No Known Allergies      Medication List     STOP taking these medications    tiZANidine 4 MG tablet Commonly known as: ZANAFLEX       TAKE these medications    aspirin 81 MG chewable tablet Chew 1 tablet (81 mg total) by mouth daily. Start taking on: January 29, 2021   atorvastatin 80 MG tablet Commonly known as: LIPITOR Take 1 tablet (80 mg total) by mouth daily. Start taking on: January 29, 2021   canagliflozin 100 MG Tabs tablet  Commonly known as: INVOKANA Take 1 tablet (100 mg total) by mouth daily before breakfast. Start taking on: January 29, 2021   metoprolol tartrate 25 MG tablet Commonly known as: LOPRESSOR Take 1 tablet (25 mg total) by mouth 2 (two) times daily.   nicotine 14 mg/24hr patch Commonly known as: NICODERM CQ - dosed in mg/24 hours Place 1 patch (14 mg total) onto the skin daily. Start taking on: January 29, 2021   nicotine polacrilex 2 MG gum Commonly known as: NICORETTE Take 1 each (2 mg total) by mouth as needed  for smoking cessation.   nitroGLYCERIN 0.4 MG SL tablet Commonly known as: NITROSTAT Place 1 tablet (0.4 mg total) under the tongue every 5 (five) minutes as needed for chest pain.   ticagrelor 90 MG Tabs tablet Commonly known as: BRILINTA Take 1 tablet (90 mg total) by mouth 2 (two) times daily.       No Known Allergies  Follow-up Information     Corky Downs, MD Follow up.   Specialties: Internal Medicine, Cardiology Contact information: [address] [place] [address] [phone]         Marcina Millard, MD Follow up in 1 week(s).   Specialty: Cardiology Contact information: [address] [facility] [place] [address] [phone]                  The results of significant diagnostics from this hospitalization (including imaging, microbiology, ancillary and laboratory) are listed below for reference.    Significant Diagnostic Studies: DG Chest 2 View  Result Date: 01/26/2021 CLINICAL DATA:  Chest pain. EXAM: CHEST - 2 VIEW COMPARISON:  None. FINDINGS: The cardiomediastinal silhouette is within normal limits. Aortic atherosclerosis is noted. The lungs are hyperinflated with mild coarsening of the interstitial markings. No airspace consolidation, edema, pleural effusion, or pneumothorax is identified. No acute osseous abnormality is seen. IMPRESSION: Hyperinflation/possible COPD. No evidence of acute cardiopulmonary process. Electronically Signed   By: Sebastian Ache M.D.   On: 01/26/2021 11:02   CARDIAC CATHETERIZATION  Result Date: 01/26/2021   Prox RCA lesion is 30% stenosed.   Mid RCA lesion is 85% stenosed.   Dist RCA lesion is 50% stenosed.   RPDA lesion is 50% stenosed.   RPAV lesion is 50% stenosed.   2nd Mrg lesion is 60% stenosed.   Mid LAD-1 lesion is 70% stenosed.   Mid LAD-2 lesion is 90% stenosed.   Prox LAD lesion is 100% stenosed.   A drug-eluting stent was successfully placed using a STENT ONYX FRONTIER  2.0X22.   A drug-eluting stent was successfully placed using a STENT ONYX FRONTIER 2.25X18.   A drug-eluting stent was successfully placed using a STENT ONYX FRONTIER 3.0X18.   Post intervention, there is a 0% residual stenosis.   Post intervention, there is a 0% residual stenosis.   Post intervention, there is a 0% residual stenosis.   There is moderate left ventricular systolic dysfunction.   The left ventricular ejection fraction is 35-45% by visual estimate. 1.  Anterior ST elevation myocardial infarction 2.  Two-vessel coronary artery disease with acute occlusion proximal LAD and 85% stenosis mid to distal RCA 3.  Moderate reduced left ventricular function with estimated LV ejection fraction 35% with apical dyskinesis and anterior wall akinesis 4.  Successful primary PCI with overlapping drug-eluting stents x3 proximal/mid/distal LAD Recommendations 1.  Dual antiplatelet therapy uninterrupted for 1 year 2.  Start metoprolol tartrate 25 mg twice daily 3.  Start atorvastatin 80  mg daily 4.  2D echocardiogram 5.  Stage PCI of distal RCA, to be scheduled   ECHOCARDIOGRAM COMPLETE  Result Date: 01/27/2021    ECHOCARDIOGRAM REPORT   Patient Name:   Elizabeth Hardin Date of Exam: 01/27/2021 Medical Rec #:  [id number]         Height:       64.0 in Accession #:    [id number]        Weight:       142.4 lb Date of Birth:  18-Jun-1960         BSA:          1.693 m Patient Age:    20 years          BP:           102/70 mmHg Patient Gender: F                 HR:           66 bpm. Exam Location:  [facility] Procedure: 2D Echo Indications:     Acute Myocardial Infarction I121.9  History:         Patient has no prior history of Echocardiogram examinations.  Sonographer:     Kathlen Brunswick RDCS Referring Phys:  [place] Diagnosing Phys: Isaias Cowman MD IMPRESSIONS  1. Left ventricular ejection fraction, by estimation, is 40 to 45%. The left ventricle has mildly decreased function. The left ventricle  demonstrates regional wall motion abnormalities (see scoring diagram/findings for description). Left ventricular diastolic parameters were normal.  2. Right ventricular systolic function is normal. The right ventricular size is normal.  3. The mitral valve is normal in structure. Trivial mitral valve regurgitation. No evidence of mitral stenosis.  4. The aortic valve is normal in structure. Aortic valve regurgitation is not visualized. No aortic stenosis is present.  5. The inferior vena cava is normal in size with greater than 50% respiratory variability, suggesting right atrial pressure of 3 mmHg. FINDINGS  Left Ventricle: Left ventricular ejection fraction, by estimation, is 40 to 45%. The left ventricle has mildly decreased function. The left ventricle demonstrates regional wall motion abnormalities. The left ventricular internal cavity size was normal in size. There is no left ventricular hypertrophy. Left ventricular diastolic parameters were normal.  LV Wall Scoring: The basal anteroseptal segment, apical lateral segment, apical septal segment, basal inferoseptal segment, and apex are akinetic. Right Ventricle: The right ventricular size is normal. No increase in right ventricular wall thickness. Right ventricular systolic function is normal. Left Atrium: Left atrial size was normal in size. Right Atrium: Right atrial size was normal in size. Pericardium: There is no evidence of pericardial effusion. Mitral Valve: The mitral valve is normal in structure. Trivial mitral valve regurgitation. No evidence of mitral valve stenosis. Tricuspid Valve: The tricuspid valve is normal in structure. Tricuspid valve regurgitation is trivial. No evidence of tricuspid stenosis. Aortic Valve: The aortic valve is normal in structure. Aortic valve regurgitation is not visualized. No aortic stenosis is present. Aortic valve peak gradient measures 4.8 mmHg. Pulmonic Valve: The pulmonic valve was normal in structure. Pulmonic  valve regurgitation is not visualized. No evidence of pulmonic stenosis. Aorta: The aortic root is normal in size and structure. Venous: The inferior vena cava is normal in size with greater than 50% respiratory variability, suggesting right atrial pressure of 3 mmHg. IAS/Shunts: No atrial level shunt detected by color flow Doppler.  LEFT VENTRICLE PLAX 2D LVIDd:  4.50 cm     Diastology LVIDs:         3.40 cm     LV e' medial:    5.66 cm/s LV PW:         1.00 cm     LV E/e' medial:  13.5 LV IVS:        1.00 cm     LV e' lateral:   5.66 cm/s LVOT diam:     1.80 cm     LV E/e' lateral: 13.5 LV SV:         48 LV SV Index:   29 LVOT Area:     2.54 cm  LV Volumes (MOD) LV vol d, MOD A2C: 69.7 ml LV vol d, MOD A4C: 72.1 ml LV vol s, MOD A2C: 40.8 ml LV vol s, MOD A4C: 38.7 ml LV SV MOD A2C:     28.9 ml LV SV MOD A4C:     72.1 ml LV SV MOD BP:      31.6 ml RIGHT VENTRICLE RV Basal diam:  2.20 cm RV S prime:     10.10 cm/s TAPSE (M-mode): 1.8 cm LEFT ATRIUM             Index        RIGHT ATRIUM           Index LA diam:        3.30 cm 1.95 cm/m   RA Area:     11.60 cm LA Vol (A2C):   63.8 ml 37.67 ml/m  RA Volume:   26.60 ml  15.71 ml/m LA Vol (A4C):   30.9 ml 18.25 ml/m LA Biplane Vol: 45.0 ml 26.57 ml/m  AORTIC VALVE                 PULMONIC VALVE AV Area (Vmax): 1.78 cm     PV Vmax:       0.66 m/s AV Vmax:        109.00 cm/s  PV Peak grad:  1.8 mmHg AV Peak Grad:   4.8 mmHg LVOT Vmax:      76.40 cm/s LVOT Vmean:     52.400 cm/s LVOT VTI:       0.190 m  AORTA Ao Root diam: 3.10 cm Ao Asc diam:  2.70 cm MITRAL VALVE MV Area (PHT): 2.82 cm    SHUNTS MV Decel Time: 269 msec    Systemic VTI:  0.19 m MV E velocity: 76.30 cm/s  Systemic Diam: 1.80 cm MV A velocity: 64.70 cm/s MV E/A ratio:  1.18 Isaias Cowman MD Electronically signed by Isaias Cowman MD Signature Date/Time: 01/27/2021/1:14:01 PM    Final     Microbiology: Recent Results (from the past 240 hour(s))  Resp Panel by RT-PCR (Flu A&B,  Covid) Nasopharyngeal Swab     Status: None   Collection Time: 01/26/21 10:39 AM   Specimen: Nasopharyngeal Swab; Nasopharyngeal(NP) swabs in vial transport medium  Result Value Ref Range Status   SARS Coronavirus 2 by RT PCR NEGATIVE NEGATIVE Final    Comment: (NOTE) SARS-CoV-2 target nucleic acids are NOT DETECTED.  The SARS-CoV-2 RNA is generally detectable in upper respiratory specimens during the acute phase of infection. The lowest concentration of SARS-CoV-2 viral copies this assay can detect is 138 copies/mL. A negative result does not preclude SARS-Cov-2 infection and should not be used as the sole basis for treatment or other patient management decisions. A negative result may occur with  improper specimen collection/handling, submission of  specimen other than nasopharyngeal swab, presence of viral mutation(s) within the areas targeted by this assay, and inadequate number of viral copies(<138 copies/mL). A negative result must be combined with clinical observations, patient history, and epidemiological information. The expected result is Negative.  Fact Sheet for Patients:  EntrepreneurPulse.com.au  Fact Sheet for Healthcare Providers:  IncredibleEmployment.be  This test is no t yet approved or cleared by the [place] FDA and  has been authorized for detection and/or diagnosis of SARS-CoV-2 by FDA under an Emergency Use Authorization (EUA). This EUA will remain  in effect (meaning this test can be used) for the duration of the COVID-19 declaration under Section 564(b)(1) of the Act, 21 U.S.C.section 360bbb-3(b)(1), unless the authorization is terminated  or revoked sooner.       Influenza A by PCR NEGATIVE NEGATIVE Final   Influenza B by PCR NEGATIVE NEGATIVE Final    Comment: (NOTE) The Xpert Xpress SARS-CoV-2/FLU/RSV plus assay is intended as an aid in the diagnosis of influenza from Nasopharyngeal swab specimens and should  not be used as a sole basis for treatment. Nasal washings and aspirates are unacceptable for Xpert Xpress SARS-CoV-2/FLU/RSV testing.  Fact Sheet for Patients: EntrepreneurPulse.com.au  Fact Sheet for Healthcare Providers: IncredibleEmployment.be  This test is not yet approved or cleared by the [place] FDA and has been authorized for detection and/or diagnosis of SARS-CoV-2 by FDA under an Emergency Use Authorization (EUA). This EUA will remain in effect (meaning this test can be used) for the duration of the COVID-19 declaration under Section 564(b)(1) of the Act, 21 U.S.C. section 360bbb-3(b)(1), unless the authorization is terminated or revoked.  Performed at [facility], [address] [place], [place] [address]      Labs: Basic Metabolic Panel: Recent Labs  Lab 01/26/21 1025 01/26/21 1037 01/27/21 0423 01/28/21 0536  NA  --  131* 133* 136  K  --  4.0 4.1 4.3  CL  --  99 104 104  CO2  --  22 23 26   GLUCOSE  --  308* 286* 261*  BUN  --  22 20 19   CREATININE  --  0.82 0.71 0.87  CALCIUM  --  9.5 8.9 8.9  MG 1.9  --   --   --    Liver Function Tests: No results for input(s): AST, ALT, ALKPHOS, BILITOT, PROT, ALBUMIN in the last 168 hours. No results for input(s): LIPASE, AMYLASE in the last 168 hours. No results for input(s): AMMONIA in the last 168 hours. CBC: Recent Labs  Lab 01/26/21 1037 01/27/21 0423  WBC 11.7* 11.4*  HGB 14.2 12.4  HCT 42.4 36.5  MCV 85.0 83.0  PLT 249 226   Cardiac Enzymes: No results for input(s): CKTOTAL, CKMB, CKMBINDEX, TROPONINI in the last 168 hours. BNP: BNP (last 3 results) No results for input(s): BNP in the last 8760 hours.  ProBNP (last 3 results) No results for input(s): PROBNP in the last 8760 hours.  CBG: Recent Labs  Lab 01/27/21 1155 01/27/21 1539 01/27/21 2037 01/28/21 0753 01/28/21 1116  GLUCAP 341* 128* 206* 180* 223*       Signed:  Desma Maxim MD.  [facility] 01/28/2021, 11:32 AM

## 2021-01-29 ENCOUNTER — Encounter: Payer: Self-pay | Admitting: Cardiology

## 2021-01-29 LAB — GLUCOSE, CAPILLARY: Glucose-Capillary: 247 mg/dL — ABNORMAL HIGH (ref 70–99)

## 2021-01-31 ENCOUNTER — Ambulatory Visit (INDEPENDENT_AMBULATORY_CARE_PROVIDER_SITE_OTHER): Payer: 59 | Admitting: Internal Medicine

## 2021-01-31 ENCOUNTER — Other Ambulatory Visit: Payer: Self-pay

## 2021-01-31 ENCOUNTER — Encounter: Payer: Self-pay | Admitting: Internal Medicine

## 2021-01-31 VITALS — BP 102/55 | HR 60 | Ht 64.0 in | Wt 145.6 lb

## 2021-01-31 DIAGNOSIS — M542 Cervicalgia: Secondary | ICD-10-CM

## 2021-01-31 DIAGNOSIS — Z1231 Encounter for screening mammogram for malignant neoplasm of breast: Secondary | ICD-10-CM | POA: Diagnosis not present

## 2021-01-31 DIAGNOSIS — F17200 Nicotine dependence, unspecified, uncomplicated: Secondary | ICD-10-CM

## 2021-01-31 DIAGNOSIS — E119 Type 2 diabetes mellitus without complications: Secondary | ICD-10-CM | POA: Diagnosis not present

## 2021-01-31 DIAGNOSIS — I10 Essential (primary) hypertension: Secondary | ICD-10-CM

## 2021-01-31 DIAGNOSIS — M159 Polyosteoarthritis, unspecified: Secondary | ICD-10-CM

## 2021-01-31 LAB — GLUCOSE, POCT (MANUAL RESULT ENTRY): POC Glucose: 269 mg/dl — AB (ref 70–99)

## 2021-01-31 MED ORDER — CYCLOBENZAPRINE HCL 5 MG PO TABS: 5.0000 mg | ORAL_TABLET | Freq: Three times a day (TID) | ORAL | 1 refills | Status: DC | PRN

## 2021-01-31 MED ORDER — EMPAGLIFLOZIN 25 MG PO TABS: 25.0000 mg | ORAL_TABLET | Freq: Every day | ORAL | 6 refills | Status: DC

## 2021-01-31 NOTE — Assessment & Plan Note (Signed)

## 2021-01-31 NOTE — Assessment & Plan Note (Signed)

## 2021-01-31 NOTE — Progress Notes (Signed)
Established Patient Office Visit  Subjective:  Patient ID: Elizabeth Hardin, female    DOB: [birth year]  Age: 61 y.o. MRN: [id number]  CC:  Chief Complaint  Patient presents with   Hospitalization Follow-up    Patient was admitted on 01/26/2021 and discharged on 01/28/2021    HPI  Elizabeth Hardin presents for pt has 3 stent put in  Past Medical History:  Diagnosis Date   Diabetes mellitus without complication (HCC)    Hypertension    Neck pain     Past Surgical History:  Procedure Laterality Date   CORONARY/GRAFT ACUTE MI REVASCULARIZATION N/A 01/26/2021   Procedure: Coronary/Graft Acute MI Revascularization;  Surgeon: Marcina Millard, MD;  Location: [facility] IN[facility];  Service: Cardiovascular;  Laterality: N/A;   LEFT HEART CATH AND CORONARY ANGIOGRAPHY N/A 01/26/2021   Procedure: LEFT HEART CATH AND CORONARY ANGIOGRAPHY;  Surgeon: Marcina Millard, MD;  Location: [facility] IN[facility] CV [facility];  Service: Cardiovascular;  Laterality: N/A;    Family History  Problem Relation Age of Onset   CAD Mother    CAD Father    CAD Brother     Social History   Socioeconomic History   Marital status: Married    Spouse name: Not on file   Number of children: Not on file   Years of education: Not on file   Highest education level: Not on file  Occupational History   Occupation: packing boxes  Tobacco Use   Smoking status: Every Day    Packs/day: 1.00    Years: 45.00    Pack years: 45.00    Types: Cigarettes   Smokeless tobacco: Never  Substance and Sexual Activity   Alcohol use: Not Currently   Drug use: Never   Sexual activity: Yes  Other Topics Concern   Not on file  Social History Narrative   Not on file   Social Determinants of Health   Financial Resource Strain: Not on file  Food Insecurity: Not on file  Transportation Needs: Not on file  Physical Activity: Not on file  Stress: Not on file  Social Connections: Not on file  Intimate Partner Violence: Not on  file     Current Outpatient Medications:    aspirin 81 MG chewable tablet, Chew 1 tablet (81 mg total) by mouth daily., Disp: 90 tablet, Rfl: 1   atorvastatin (LIPITOR) 80 MG tablet, Take 1 tablet (80 mg total) by mouth daily., Disp: 90 tablet, Rfl: 1   cyclobenzaprine (FLEXERIL) 5 MG tablet, Take 1 tablet (5 mg total) by mouth 3 (three) times daily as needed for muscle spasms., Disp: 90 tablet, Rfl: 1   metoprolol tartrate (LOPRESSOR) 25 MG tablet, Take 1 tablet (25 mg total) by mouth 2 (two) times daily., Disp: 60 tablet, Rfl: 1   nicotine (NICODERM CQ - DOSED IN MG/24 HOURS) 14 mg/24hr patch, Place 1 patch (14 mg total) onto the skin daily., Disp: 28 patch, Rfl: 0   nicotine polacrilex (NICORETTE) 2 MG gum, Take 1 each (2 mg total) by mouth as needed for smoking cessation., Disp: 100 tablet, Rfl: 0   nitroGLYCERIN (NITROSTAT) 0.4 MG SL tablet, Place 1 tablet (0.4 mg total) under the tongue every 5 (five) minutes as needed for chest pain., Disp: 30 tablet, Rfl: 12   ticagrelor (BRILINTA) 90 MG TABS tablet, Take 1 tablet (90 mg total) by mouth 2 (two) times daily., Disp: 60 tablet, Rfl: 1   Allergies  Allergen Reactions   Metformin And Related Diarrhea  ROS Review of Systems  Constitutional: Negative.   HENT: Negative.    Eyes: Negative.   Respiratory: Negative.    Cardiovascular: Negative.   Gastrointestinal: Negative.   Endocrine: Negative.   Genitourinary: Negative.   Musculoskeletal: Negative.   Skin: Negative.   Allergic/Immunologic: Negative.   Neurological: Negative.   Hematological: Negative.   Psychiatric/Behavioral: Negative.    All other systems reviewed and are negative.    Objective:    Physical Exam Vitals reviewed.  Constitutional:      Appearance: Normal appearance.  HENT:     Mouth/Throat:     Mouth: Mucous membranes are moist.  Eyes:     Pupils: Pupils are equal, round, and reactive to light.  Neck:     Vascular: No carotid bruit.   Cardiovascular:     Rate and Rhythm: Normal rate and regular rhythm.     Pulses: Normal pulses.     Heart sounds: Normal heart sounds.  Pulmonary:     Effort: Pulmonary effort is normal.     Breath sounds: Normal breath sounds.  Abdominal:     General: Bowel sounds are normal.     Palpations: Abdomen is soft. There is no hepatomegaly, splenomegaly or mass.     Tenderness: There is no abdominal tenderness.     Hernia: No hernia is present.  Musculoskeletal:        General: No tenderness.     Cervical back: Neck supple.     Right lower leg: No edema.     Left lower leg: No edema.  Skin:    Findings: No rash.  Neurological:     Mental Status: She is alert and oriented to person, place, and time.     Motor: No weakness.  Psychiatric:        Mood and Affect: Mood and affect normal.        Behavior: Behavior normal.    BP (!) 102/55    Pulse 60    Ht 5\' 4"  (1.626 m)    Wt 145 lb 9.6 oz (66 kg)    BMI 24.99 kg/m  Wt Readings from Last 3 Encounters:  01/31/21 145 lb 9.6 oz (66 kg)  01/26/21 142 lb 6.7 oz (64.6 kg)  11/23/20 153 lb (69.4 kg)     Health Maintenance Due  Topic Date Due   Pneumococcal Vaccine 6219-62 Years old (1 - PCV) Never done   URINE MICROALBUMIN  Never done   Hepatitis C Screening  Never done   MAMMOGRAM  Never done    There are no preventive care reminders to display for this patient.  Lab Results  Component Value Date   TSH 1.470 01/26/2021   Lab Results  Component Value Date   WBC 11.4 (H) 01/27/2021   HGB 12.4 01/27/2021   HCT 36.5 01/27/2021   MCV 83.0 01/27/2021   PLT 226 01/27/2021   Lab Results  Component Value Date   NA 136 01/28/2021   K 4.3 01/28/2021   CO2 26 01/28/2021   GLUCOSE 261 (H) 01/28/2021   BUN 19 01/28/2021   CREATININE 0.87 01/28/2021   BILITOT 1.0 11/23/2020   ALKPHOS 55 11/23/2020   AST 15 11/23/2020   ALT 15 11/23/2020   PROT 7.3 11/23/2020   ALBUMIN 4.3 11/23/2020   CALCIUM 8.9 01/28/2021   ANIONGAP 6  01/28/2021   Lab Results  Component Value Date   CHOL 167 01/26/2021   Lab Results  Component Value Date   HDL 57 01/26/2021  Lab Results  Component Value Date   LDLCALC 99 01/26/2021   Lab Results  Component Value Date   TRIG 57 01/26/2021   Lab Results  Component Value Date   CHOLHDL 2.9 01/26/2021   Lab Results  Component Value Date   HGBA1C 9.7 (H) 01/26/2021      Assessment & Plan:   Problem List Items Addressed This Visit       Cardiovascular and Mediastinum   Essential hypertension (Chronic)     Patient denies any chest pain or shortness of breath there is no history of palpitation or paroxysmal nocturnal dyspnea   patient was advised to follow low-salt low-cholesterol diet    ideally I want to keep systolic blood pressure below 956130 mmHg, patient was asked to check blood pressure one times a week and give me a report on that.  Patient will be follow-up in 3 months  or earlier as needed, patient will call me back for any change in the cardiovascular symptoms Patient was advised to buy a book from local bookstore concerning blood pressure and read several chapters  every day.  This will be supplemented by some of the material we will give him from the office.  Patient should also utilize other resources like YouTube and Internet to learn more about the blood pressure and the diet.        Endocrine   Diabetes mellitus (HCC) - Primary    - The patient's blood sugar is labile on med. - The patient will continue the current treatment regimen.  - I encouraged the patient to regularly check blood sugar.  - I encouraged the patient to monitor diet. I encouraged the patient to eat low-carb and low-sugar to help prevent blood sugar spikes.  - I encouraged the patient to continue following their prescribed treatment plan for diabetes - I informed the patient to get help if blood sugar drops below 54mg /dL, or if suddenly have trouble thinking clearly or breathing.   Patient was advised to buy a book on diabetes from a local bookstore or from [employer].  Patient should read 2 chapters every day to keep the motivation going, this is in addition to some of the materials we provided them from the office.  There are other resources on the Internet like YouTube and wilkipedia to get an education on the diabetes      Relevant Orders   POCT glucose (manual entry) (Completed)     Musculoskeletal and Integument   Primary osteoarthritis involving multiple joints    Stable at the present time      Relevant Medications   cyclobenzaprine (FLEXERIL) 5 MG tablet     Other   Tobacco dependence (Chronic)    - I instructed the patient to stop smoking and provided them with smoking cessation materials.  - I informed the patient that smoking puts them at increased risk for cancer, COPD, hypertension, and more.  - Informed the patient to seek help if they begin to have trouble breathing, develop chest pain, start to cough up blood, feel faint, or pass out.      Neck pain   Other Visit Diagnoses     Encounter for screening mammogram for malignant neoplasm of breast       Relevant Orders   MM 3D SCREEN BREAST BILATERAL       Meds ordered this encounter  Medications   cyclobenzaprine (FLEXERIL) 5 MG tablet    Sig: Take 1 tablet (5 mg total) by mouth 3 (  three) times daily as needed for muscle spasms.    Dispense:  90 tablet    Refill:  1    Follow-up: No follow-ups on file.    Corky Downs, MD

## 2021-01-31 NOTE — Assessment & Plan Note (Signed)
-   I instructed the patient to stop smoking and provided them with smoking cessation materials.  - I informed the patient that smoking puts them at increased risk for cancer, COPD, hypertension, and more.  - Informed the patient to seek help if they begin to have trouble breathing, develop chest pain, start to cough up blood, feel faint, or pass out.  

## 2021-01-31 NOTE — Assessment & Plan Note (Signed)
Stable at the present time. 

## 2021-02-09 DIAGNOSIS — E119 Type 2 diabetes mellitus without complications: Secondary | ICD-10-CM | POA: Insufficient documentation

## 2021-02-09 DIAGNOSIS — E7800 Pure hypercholesterolemia, unspecified: Secondary | ICD-10-CM | POA: Insufficient documentation

## 2021-02-15 ENCOUNTER — Other Ambulatory Visit: Payer: Self-pay

## 2021-02-15 ENCOUNTER — Observation Stay
Admission: RE | Admit: 2021-02-15 | Discharge: 2021-02-16 | Disposition: A | Discharge status: Home / Self Care | Payer: 59 | Attending: Cardiology | Admitting: Cardiology

## 2021-02-15 ENCOUNTER — Encounter
Admission: RE | Disposition: A | Discharge status: Home / Self Care | Payer: Self-pay | Source: Home / Self Care | Attending: Cardiology

## 2021-02-15 ENCOUNTER — Encounter: Payer: Self-pay | Admitting: Cardiology

## 2021-02-15 DIAGNOSIS — E7800 Pure hypercholesterolemia, unspecified: Secondary | ICD-10-CM | POA: Diagnosis not present

## 2021-02-15 DIAGNOSIS — Z955 Presence of coronary angioplasty implant and graft: Secondary | ICD-10-CM | POA: Diagnosis not present

## 2021-02-15 DIAGNOSIS — I10 Essential (primary) hypertension: Secondary | ICD-10-CM | POA: Diagnosis not present

## 2021-02-15 DIAGNOSIS — E119 Type 2 diabetes mellitus without complications: Secondary | ICD-10-CM | POA: Diagnosis not present

## 2021-02-15 DIAGNOSIS — Z87891 Personal history of nicotine dependence: Secondary | ICD-10-CM | POA: Diagnosis not present

## 2021-02-15 DIAGNOSIS — Z79899 Other long term (current) drug therapy: Secondary | ICD-10-CM | POA: Diagnosis not present

## 2021-02-15 DIAGNOSIS — Z7984 Long term (current) use of oral hypoglycemic drugs: Secondary | ICD-10-CM | POA: Insufficient documentation

## 2021-02-15 DIAGNOSIS — J449 Chronic obstructive pulmonary disease, unspecified: Secondary | ICD-10-CM | POA: Insufficient documentation

## 2021-02-15 DIAGNOSIS — I2510 Atherosclerotic heart disease of native coronary artery without angina pectoris: Principal | ICD-10-CM | POA: Insufficient documentation

## 2021-02-15 DIAGNOSIS — I6521 Occlusion and stenosis of right carotid artery: Secondary | ICD-10-CM

## 2021-02-15 HISTORY — DX: Hyperlipidemia, unspecified: E78.5

## 2021-02-15 HISTORY — DX: Atherosclerotic heart disease of native coronary artery without angina pectoris: I25.10

## 2021-02-15 HISTORY — PX: CORONARY STENT INTERVENTION: CATH118234

## 2021-02-15 LAB — POCT ACTIVATED CLOTTING TIME: Activated Clotting Time: 341 seconds

## 2021-02-15 LAB — GLUCOSE, CAPILLARY: Glucose-Capillary: 141 mg/dL — ABNORMAL HIGH (ref 70–99)

## 2021-02-15 SURGERY — CORONARY STENT INTERVENTION
Anesthesia: Moderate Sedation

## 2021-02-15 MED ORDER — TICAGRELOR 90 MG PO TABS
90.0000 mg | ORAL_TABLET | Freq: Two times a day (BID) | ORAL | Status: DC
  Administered 2021-02-15 – 2021-02-16 (×2): 90 mg via ORAL
  Filled 2021-02-15 (×2): qty 1

## 2021-02-15 MED ORDER — SODIUM CHLORIDE 0.9 % WEIGHT BASED INFUSION: 1.0000 mL/kg/h | INTRAVENOUS | Status: AC

## 2021-02-15 MED ORDER — HEPARIN SODIUM (PORCINE) 1000 UNIT/ML IJ SOLN
INTRAMUSCULAR | Status: DC | PRN
  Administered 2021-02-15: 5000 [IU] via INTRAVENOUS

## 2021-02-15 MED ORDER — METOPROLOL TARTRATE 50 MG PO TABS
25.0000 mg | ORAL_TABLET | Freq: Two times a day (BID) | ORAL | Status: DC
  Administered 2021-02-16: 25 mg via ORAL
  Filled 2021-02-15: qty 1

## 2021-02-15 MED ORDER — HYDRALAZINE HCL 20 MG/ML IJ SOLN: 10.0000 mg | INTRAMUSCULAR | Status: AC | PRN

## 2021-02-15 MED ORDER — SODIUM CHLORIDE 0.9 % IV SOLN: 250.0000 mL | INTRAVENOUS | Status: DC | PRN

## 2021-02-15 MED ORDER — LISINOPRIL 5 MG PO TABS
5.0000 mg | ORAL_TABLET | Freq: Every day | ORAL | Status: DC
  Administered 2021-02-16: 5 mg via ORAL
  Filled 2021-02-15: qty 1

## 2021-02-15 MED ORDER — SODIUM CHLORIDE 0.9 % WEIGHT BASED INFUSION
3.0000 mL/kg/h | INTRAVENOUS | Status: AC
  Administered 2021-02-15: 3 mL/kg/h via INTRAVENOUS

## 2021-02-15 MED ORDER — ASPIRIN 81 MG PO CHEW: 81.0000 mg | CHEWABLE_TABLET | ORAL | Status: DC

## 2021-02-15 MED ORDER — IOHEXOL 300 MG/ML  SOLN
INTRAMUSCULAR | Status: DC | PRN
  Administered 2021-02-15: 90 mL

## 2021-02-15 MED ORDER — TICAGRELOR 90 MG PO TABS
ORAL_TABLET | ORAL | Status: DC | PRN
  Administered 2021-02-15: 90 mg via ORAL

## 2021-02-15 MED ORDER — ONDANSETRON HCL 4 MG/2ML IJ SOLN: 4.0000 mg | Freq: Four times a day (QID) | INTRAMUSCULAR | Status: DC | PRN

## 2021-02-15 MED ORDER — SODIUM CHLORIDE 0.9% FLUSH
3.0000 mL | Freq: Two times a day (BID) | INTRAVENOUS | Status: DC
  Administered 2021-02-15 – 2021-02-16 (×2): 3 mL via INTRAVENOUS

## 2021-02-15 MED ORDER — VERAPAMIL HCL 2.5 MG/ML IV SOLN
INTRAVENOUS | Status: DC | PRN
  Administered 2021-02-15: 2.5 mg via INTRA_ARTERIAL

## 2021-02-15 MED ORDER — SODIUM CHLORIDE 0.9 % WEIGHT BASED INFUSION: 1.0000 mL/kg/h | INTRAVENOUS | Status: DC

## 2021-02-15 MED ORDER — VERAPAMIL HCL 2.5 MG/ML IV SOLN
INTRAVENOUS | Status: AC
  Filled 2021-02-15: qty 2

## 2021-02-15 MED ORDER — HEPARIN (PORCINE) IN NACL 1000-0.9 UT/500ML-% IV SOLN
INTRAVENOUS | Status: AC
  Filled 2021-02-15: qty 1000

## 2021-02-15 MED ORDER — SODIUM CHLORIDE 0.9% FLUSH: 3.0000 mL | INTRAVENOUS | Status: DC | PRN

## 2021-02-15 MED ORDER — HEPARIN (PORCINE) IN NACL 1000-0.9 UT/500ML-% IV SOLN
INTRAVENOUS | Status: DC | PRN
  Administered 2021-02-15 (×2): 500 mL

## 2021-02-15 MED ORDER — ASPIRIN 81 MG PO CHEW
81.0000 mg | CHEWABLE_TABLET | Freq: Every day | ORAL | Status: DC
  Administered 2021-02-16: 81 mg via ORAL
  Filled 2021-02-15: qty 1

## 2021-02-15 MED ORDER — EMPAGLIFLOZIN 25 MG PO TABS
25.0000 mg | ORAL_TABLET | Freq: Every day | ORAL | Status: DC
  Administered 2021-02-16: 25 mg via ORAL
  Filled 2021-02-15: qty 1

## 2021-02-15 MED ORDER — FENTANYL CITRATE (PF) 100 MCG/2ML IJ SOLN
INTRAMUSCULAR | Status: DC | PRN
  Administered 2021-02-15: 25 ug via INTRAVENOUS

## 2021-02-15 MED ORDER — FENTANYL CITRATE (PF) 100 MCG/2ML IJ SOLN
INTRAMUSCULAR | Status: AC
  Filled 2021-02-15: qty 2

## 2021-02-15 MED ORDER — TICAGRELOR 90 MG PO TABS
ORAL_TABLET | ORAL | Status: AC
  Filled 2021-02-15: qty 1

## 2021-02-15 MED ORDER — GLIPIZIDE ER 2.5 MG PO TB24
2.5000 mg | ORAL_TABLET | Freq: Every day | ORAL | Status: DC
  Administered 2021-02-16: 2.5 mg via ORAL
  Filled 2021-02-15: qty 1

## 2021-02-15 MED ORDER — MIDAZOLAM HCL 2 MG/2ML IJ SOLN
INTRAMUSCULAR | Status: AC
  Filled 2021-02-15: qty 2

## 2021-02-15 MED ORDER — ATORVASTATIN CALCIUM 80 MG PO TABS
80.0000 mg | ORAL_TABLET | Freq: Every day | ORAL | Status: DC
  Administered 2021-02-15: 80 mg via ORAL
  Filled 2021-02-15: qty 1

## 2021-02-15 MED ORDER — MIDAZOLAM HCL 2 MG/2ML IJ SOLN
INTRAMUSCULAR | Status: DC | PRN
  Administered 2021-02-15: 1 mg via INTRAVENOUS

## 2021-02-15 MED ORDER — HEPARIN SODIUM (PORCINE) 1000 UNIT/ML IJ SOLN
INTRAMUSCULAR | Status: AC
  Filled 2021-02-15: qty 10

## 2021-02-15 MED ORDER — LIDOCAINE HCL (PF) 1 % IJ SOLN
INTRAMUSCULAR | Status: DC | PRN
  Administered 2021-02-15: 2 mL

## 2021-02-15 MED ORDER — ACETAMINOPHEN 325 MG PO TABS: 650.0000 mg | ORAL_TABLET | ORAL | Status: DC | PRN

## 2021-02-15 MED ORDER — LABETALOL HCL 5 MG/ML IV SOLN: 10.0000 mg | INTRAVENOUS | Status: AC | PRN

## 2021-02-15 MED ORDER — LIDOCAINE HCL 1 % IJ SOLN
INTRAMUSCULAR | Status: AC
  Filled 2021-02-15: qty 20

## 2021-02-15 SURGICAL SUPPLY — 19 items
BALLN TREK RX 2.25X12 (BALLOONS) ×2
BALLN ~~LOC~~ TREK RX 2.75X12 (BALLOONS) ×2
BALLOON TREK RX 2.25X12 (BALLOONS) IMPLANT
BALLOON ~~LOC~~ TREK RX 2.75X12 (BALLOONS) IMPLANT
CATH INFINITI 5 FR JL3.5 (CATHETERS) ×1 IMPLANT
CATH VISTA GUIDE 6FR JR4 SH (CATHETERS) ×1 IMPLANT
DEVICE RAD TR BAND REGULAR (VASCULAR PRODUCTS) ×1 IMPLANT
DRAPE BRACHIAL (DRAPES) ×1 IMPLANT
GLIDESHEATH SLEND SS 6F .021 (SHEATH) ×1 IMPLANT
GUIDEWIRE INQWIRE 1.5J.035X260 (WIRE) IMPLANT
INQWIRE 1.5J .035X260CM (WIRE) ×2
KIT ENCORE 26 ADVANTAGE (KITS) ×1 IMPLANT
PACK CARDIAC CATH (CUSTOM PROCEDURE TRAY) ×2 IMPLANT
PROTECTION STATION PRESSURIZED (MISCELLANEOUS) ×2
SET ATX SIMPLICITY (MISCELLANEOUS) ×1 IMPLANT
STATION PROTECTION PRESSURIZED (MISCELLANEOUS) IMPLANT
STENT ONYX FRONTIER 2.5X15 (Permanent Stent) ×1 IMPLANT
TUBING CIL FLEX 10 FLL-RA (TUBING) ×1 IMPLANT
WIRE G HI TQ BMW 190 (WIRE) ×1 IMPLANT

## 2021-02-16 DIAGNOSIS — I2510 Atherosclerotic heart disease of native coronary artery without angina pectoris: Secondary | ICD-10-CM | POA: Diagnosis not present

## 2021-02-16 LAB — CBC
HCT: 35.4 % — ABNORMAL LOW (ref 36.0–46.0)
Hemoglobin: 11.6 g/dL — ABNORMAL LOW (ref 12.0–15.0)
MCH: 28 pg (ref 26.0–34.0)
MCHC: 32.8 g/dL (ref 30.0–36.0)
MCV: 85.5 fL (ref 80.0–100.0)
Platelets: 209 10*3/uL (ref 150–400)
RBC: 4.14 MIL/uL (ref 3.87–5.11)
RDW: 14.1 % (ref 11.5–15.5)
WBC: 6.4 10*3/uL (ref 4.0–10.5)
nRBC: 0 % (ref 0.0–0.2)

## 2021-02-16 LAB — BASIC METABOLIC PANEL
Anion gap: 8 (ref 5–15)
BUN: 25 mg/dL — ABNORMAL HIGH (ref 8–23)
CO2: 21 mmol/L — ABNORMAL LOW (ref 22–32)
Calcium: 8.8 mg/dL — ABNORMAL LOW (ref 8.9–10.3)
Chloride: 108 mmol/L (ref 98–111)
Creatinine, Ser: 0.91 mg/dL (ref 0.44–1.00)
GFR, Estimated: >60 mL/min (ref >60)
Glucose, Bld: 244 mg/dL — ABNORMAL HIGH (ref 70–99)
Potassium: 4.8 mmol/L (ref 3.5–5.1)
Sodium: 137 mmol/L (ref 135–145)

## 2021-02-16 MED ORDER — LISINOPRIL 5 MG PO TABS: 5.0000 mg | ORAL_TABLET | Freq: Every day | ORAL | 2 refills | Status: DC

## 2021-02-16 NOTE — Discharge Summary (Signed)
Discharge Summary      Patient ID: Elizabeth Hardin MRN: [id number] DOB/AGE: 1960/05/31 61 y.o.  Admit date: 02/15/2021 Discharge date: 02/16/2021  Primary Discharge Diagnosis stenosis of right coronary artery Secondary Discharge Diagnosis same, s/p staged PCI with DES to mid RCA  Significant Diagnostic Studies:  Cardiac cath 02/14/2021 conclusion      Prox RCA lesion is 30% stenosed.   Mid RCA lesion is 85% stenosed.   Dist RCA lesion is 50% stenosed.   2nd Mrg lesion is 60% stenosed.   RPDA lesion is 50% stenosed.   RPAV lesion is 50% stenosed.   Non-stenotic Mid LAD-2 lesion was previously treated.   Non-stenotic Mid LAD-1 lesion was previously treated.   Non-stenotic Prox LAD lesion was previously treated.   A drug-eluting stent was successfully placed using a STENT ONYX FRONTIER 2.5X15.   Post intervention, there is a 0% residual stenosis.   1.  Patent stents proximal/mid LAD 2.  High-grade 85% stenosis mid RCA 3.  Successful staged PCI with DES mid RCA   Recommendations   1.  Dual antiplatelet therapy uninterrupted for 1 year 2.  Continue aggressive risk factor modification  Consults: none  Hospital Course: The patient presented to [facility] 02/15/2021 for staged PCI with Dr. Marcina Millard.  She was brought to the cardiac Cath Lab and underwent left heart cath which revealed patent stents in proximal/mid LAD, high-grade 85% stenosis mid RCA.  PCI with DES to mid RCA was performed.  The patient tolerated the procedure well.  She was kept overnight in the morning after the procedure she denies any chest pain, sob, other complaints.  She is stable for discharge and will follow-up in office with Dr. Darrold Junker in 1 to 2 weeks.  Recommend cardiac rehab, dual antiplatelet therapy unerupted for 1 year, aggressive risk factor modification.   Discharge Exam: Blood pressure (!) 103/51, pulse 60, temperature 98.1 F (36.7 C), temperature source Oral, resp. rate 17,  height 5\' 4"  (1.626 m), weight 66.2 kg, SpO2 97 %.    PHYSICAL EXAM General: Pleasant Caucasian female, well nourished, in no acute distress.  HEENT:  Normocephalic and atraumatic. Neck:   No JVD.  Lungs: Normal respiratory effort on room air.  Clear to ascultation bilaterally. Heart: HRRR . Normal S1 and S2 without gallops or murmurs. Radial and DP pulses 2+ bilaterally. Abdomen: non-distended appearing.  Msk: Normal strength and tone for age. Extremities: No clubbing, cyanosis, edema.  Right wrist with gauze and tegaderm in place. No erythema or tenderness.  Neuro: Alert and oriented x3 Psych:  mood appropriate for situation.    Labs:   Lab Results  Component Value Date   WBC 6.4 02/16/2021   HGB 11.6 (L) 02/16/2021   HCT 35.4 (L) 02/16/2021   MCV 85.5 02/16/2021   PLT 209 02/16/2021    Recent Labs  Lab 02/16/21 0422  NA 137  K 4.8  CL 108  CO2 21*  BUN 25*  CREATININE 0.91  CALCIUM 8.8*  GLUCOSE 244*     EKG: Normal sinus rhythm rate 63 anterolateral T wave inversions.  FOLLOW UP PLANS AND APPOINTMENTS Discharge Instructions     AMB Referral to Cardiac Rehabilitation - Phase II   Complete by: As directed    Diagnosis: Coronary Stents   After initial evaluation and assessments completed: Virtual Based Care may be provided alone or in conjunction with Phase 2 Cardiac Rehab based on patient barriers.: Yes      Allergies as  of 02/16/2021       Reactions   Metformin And Related Diarrhea        Medication List     TAKE these medications    aspirin 81 MG chewable tablet Chew 1 tablet (81 mg total) by mouth daily.   atorvastatin 80 MG tablet Commonly known as: LIPITOR Take 1 tablet (80 mg total) by mouth daily.   cyclobenzaprine 5 MG tablet Commonly known as: FLEXERIL Take 1 tablet (5 mg total) by mouth 3 (three) times daily as needed for muscle spasms.   empagliflozin 25 MG Tabs tablet Commonly known as: Jardiance Take 1 tablet (25 mg total) by  mouth daily before breakfast.   lisinopril 5 MG tablet Commonly known as: ZESTRIL Take 1 tablet (5 mg total) by mouth daily.   metoprolol tartrate 25 MG tablet Commonly known as: LOPRESSOR Take 1 tablet (25 mg total) by mouth 2 (two) times daily.   nicotine 14 mg/24hr patch Commonly known as: NICODERM CQ - dosed in mg/24 hours Place 1 patch (14 mg total) onto the skin daily.   nicotine polacrilex 2 MG gum Commonly known as: NICORETTE Take 1 each (2 mg total) by mouth as needed for smoking cessation.   nitroGLYCERIN 0.4 MG SL tablet Commonly known as: NITROSTAT Place 1 tablet (0.4 mg total) under the tongue every 5 (five) minutes as needed for chest pain.   ticagrelor 90 MG Tabs tablet Commonly known as: BRILINTA Take 1 tablet (90 mg total) by mouth 2 (two) times daily.        Follow-up Information     Paraschos, Alexander, MD. Go in 1 week(s).   Specialty: Cardiology Why: Please follow up in office in 1-2 weeks after discharge Contact information: [address] [facility] [place] [address] [phone]                 BRING ALL MEDICATIONS WITH YOU TO FOLLOW UP APPOINTMENTS  Time spent with patient: >30 minutes Signed:  Cheryln Manly Hodges Treiber PA-C 02/16/2021, 8:30 AM

## 2021-02-23 DIAGNOSIS — Z955 Presence of coronary angioplasty implant and graft: Secondary | ICD-10-CM | POA: Insufficient documentation

## 2021-02-28 ENCOUNTER — Ambulatory Visit (INDEPENDENT_AMBULATORY_CARE_PROVIDER_SITE_OTHER): Payer: 59 | Admitting: Internal Medicine

## 2021-02-28 ENCOUNTER — Encounter: Payer: Self-pay | Admitting: Internal Medicine

## 2021-02-28 ENCOUNTER — Other Ambulatory Visit: Payer: Self-pay

## 2021-02-28 VITALS — BP 106/63 | HR 63 | Ht 64.0 in | Wt 141.0 lb

## 2021-02-28 DIAGNOSIS — I10 Essential (primary) hypertension: Secondary | ICD-10-CM | POA: Diagnosis not present

## 2021-02-28 DIAGNOSIS — E1169 Type 2 diabetes mellitus with other specified complication: Secondary | ICD-10-CM

## 2021-02-28 DIAGNOSIS — M159 Polyosteoarthritis, unspecified: Secondary | ICD-10-CM | POA: Diagnosis not present

## 2021-02-28 DIAGNOSIS — F17200 Nicotine dependence, unspecified, uncomplicated: Secondary | ICD-10-CM

## 2021-02-28 DIAGNOSIS — I213 ST elevation (STEMI) myocardial infarction of unspecified site: Secondary | ICD-10-CM

## 2021-02-28 DIAGNOSIS — M542 Cervicalgia: Secondary | ICD-10-CM

## 2021-02-28 LAB — GLUCOSE, POCT (MANUAL RESULT ENTRY): POC Glucose: 246 mg/dl — AB (ref 70–99)

## 2021-02-28 MED ORDER — GLIMEPIRIDE 2 MG PO TABS: 2.0000 mg | ORAL_TABLET | Freq: Every day | ORAL | 3 refills | Status: DC

## 2021-02-28 NOTE — Assessment & Plan Note (Signed)
Patient has a stent placed recently in [place] [facility]

## 2021-02-28 NOTE — Assessment & Plan Note (Signed)

## 2021-02-28 NOTE — Progress Notes (Signed)
Established Patient Office Visit  Subjective:  Patient ID: Elizabeth Hardin, female    DOB: 07-16-1960  Age: 61 y.o. MRN: [id number]  CC:  Chief Complaint  Patient presents with   hospital folow up     HPI  Elizabeth Hardin presents for check up, pt has stent mid RCA  Past Medical History:  Diagnosis Date   Coronary artery disease    Diabetes mellitus without complication (HCC)    Hyperlipidemia    Hypertension    Neck pain     Past Surgical History:  Procedure Laterality Date   CORONARY STENT INTERVENTION N/A 02/15/2021   Procedure: CORONARY STENT INTERVENTION;  Surgeon: Marcina Millard, MD;  Location: [facility];  Service: Cardiovascular;  Laterality: N/A;   CORONARY/GRAFT ACUTE MI REVASCULARIZATION N/A 01/26/2021   Procedure: Coronary/Graft Acute MI Revascularization;  Surgeon: Marcina Millard, MD;  Location: [facility];  Service: Cardiovascular;  Laterality: N/A;   LEFT HEART CATH AND CORONARY ANGIOGRAPHY N/A 01/26/2021   Procedure: LEFT HEART CATH AND CORONARY ANGIOGRAPHY;  Surgeon: Marcina Millard, MD;  Location: [facility] IN[facility];  Service: Cardiovascular;  Laterality: N/A;    Family History  Problem Relation Age of Onset   CAD Mother    CAD Father    CAD Brother     Social History   Socioeconomic History   Marital status: Married    Spouse name: Not on file   Number of children: Not on file   Years of education: Not on file   Highest education level: Not on file  Occupational History   Occupation: packing boxes  Tobacco Use   Smoking status: Former    Packs/day: 1.00    Years: 45.00    Pack years: 45.00    Types: Cigarettes    Quit date: 01/26/2021    Years since quitting: 0.0   Smokeless tobacco: Never  Substance and Sexual Activity   Alcohol use: Not Currently   Drug use: Never   Sexual activity: Yes  Other Topics Concern   Not on file  Social History Narrative   Not on file   Social Determinants of  Health   Financial Resource Strain: Not on file  Food Insecurity: Not on file  Transportation Needs: Not on file  Physical Activity: Not on file  Stress: Not on file  Social Connections: Not on file  Intimate Partner Violence: Not on file     Current Outpatient Medications:    aspirin 81 MG chewable tablet, Chew 1 tablet (81 mg total) by mouth daily., Disp: 90 tablet, Rfl: 1   atorvastatin (LIPITOR) 80 MG tablet, Take 1 tablet (80 mg total) by mouth daily., Disp: 90 tablet, Rfl: 1   cyclobenzaprine (FLEXERIL) 5 MG tablet, Take 1 tablet (5 mg total) by mouth 3 (three) times daily as needed for muscle spasms., Disp: 90 tablet, Rfl: 1   empagliflozin (JARDIANCE) 25 MG TABS tablet, Take 1 tablet (25 mg total) by mouth daily before breakfast., Disp: 30 tablet, Rfl: 6   lisinopril (ZESTRIL) 5 MG tablet, Take 1 tablet (5 mg total) by mouth daily., Disp: 30 tablet, Rfl: 2   metoprolol tartrate (LOPRESSOR) 25 MG tablet, Take 1 tablet (25 mg total) by mouth 2 (two) times daily., Disp: 60 tablet, Rfl: 1   nicotine (NICODERM CQ - DOSED IN MG/24 HOURS) 14 mg/24hr patch, Place 1 patch (14 mg total) onto the skin daily., Disp: 28 patch, Rfl: 0   nicotine polacrilex (NICORETTE) 2 MG  gum, Take 1 each (2 mg total) by mouth as needed for smoking cessation., Disp: 100 tablet, Rfl: 0   nitroGLYCERIN (NITROSTAT) 0.4 MG SL tablet, Place 1 tablet (0.4 mg total) under the tongue every 5 (five) minutes as needed for chest pain., Disp: 30 tablet, Rfl: 12   ticagrelor (BRILINTA) 90 MG TABS tablet, Take 1 tablet (90 mg total) by mouth 2 (two) times daily., Disp: 60 tablet, Rfl: 1   Allergies  Allergen Reactions   Metformin And Related Diarrhea    ROS Review of Systems  Constitutional: Negative.   HENT: Negative.    Eyes: Negative.   Respiratory: Negative.  Negative for cough.   Cardiovascular: Negative.   Gastrointestinal: Negative.   Endocrine: Negative.   Genitourinary: Negative.   Musculoskeletal:  Negative.   Skin: Negative.   Allergic/Immunologic: Negative.   Neurological: Negative.  Negative for dizziness and light-headedness.  Hematological: Negative.   Psychiatric/Behavioral: Negative.  Negative for agitation and confusion.   All other systems reviewed and are negative.    Objective:    Physical Exam Vitals reviewed.  Constitutional:      Appearance: Normal appearance.  HENT:     Mouth/Throat:     Mouth: Mucous membranes are moist.  Eyes:     Pupils: Pupils are equal, round, and reactive to light.  Neck:     Vascular: No carotid bruit.  Cardiovascular:     Rate and Rhythm: Normal rate and regular rhythm.     Pulses: Normal pulses.     Heart sounds: Normal heart sounds.  Pulmonary:     Effort: Pulmonary effort is normal.     Breath sounds: Normal breath sounds.  Abdominal:     General: Bowel sounds are normal.     Palpations: Abdomen is soft. There is no hepatomegaly, splenomegaly or mass.     Tenderness: There is no abdominal tenderness.     Hernia: No hernia is present.  Musculoskeletal:        General: No tenderness.     Cervical back: Neck supple.     Right lower leg: No edema.     Left lower leg: No edema.  Skin:    Findings: No rash.  Neurological:     Mental Status: She is alert and oriented to person, place, and time.     Motor: No weakness.  Psychiatric:        Mood and Affect: Mood and affect normal.        Behavior: Behavior normal.    BP 106/63    Pulse 63    Ht 5\' 4"  (1.626 m)    Wt 141 lb (64 kg)    BMI 24.20 kg/m  Wt Readings from Last 3 Encounters:  02/28/21 141 lb (64 kg)  02/15/21 146 lb (66.2 kg)  01/31/21 145 lb 9.6 oz (66 kg)     Health Maintenance Due  Topic Date Due   COVID-19 Vaccine (1) Never done   OPHTHALMOLOGY EXAM  Never done   Hepatitis C Screening  Never done   PAP SMEAR-Modifier  Never done   MAMMOGRAM  Never done    There are no preventive care reminders to display for this patient.  Lab Results   Component Value Date   TSH 1.470 01/26/2021   Lab Results  Component Value Date   WBC 6.4 02/16/2021   HGB 11.6 (L) 02/16/2021   HCT 35.4 (L) 02/16/2021   MCV 85.5 02/16/2021   PLT 209 02/16/2021   Lab Results  Component Value Date   NA 137 02/16/2021   K 4.8 02/16/2021   CO2 21 (L) 02/16/2021   GLUCOSE 244 (H) 02/16/2021   BUN 25 (H) 02/16/2021   CREATININE 0.91 02/16/2021   BILITOT 1.0 11/23/2020   ALKPHOS 55 11/23/2020   AST 15 11/23/2020   ALT 15 11/23/2020   PROT 7.3 11/23/2020   ALBUMIN 4.3 11/23/2020   CALCIUM 8.8 (L) 02/16/2021   ANIONGAP 8 02/16/2021   Lab Results  Component Value Date   CHOL 167 01/26/2021   Lab Results  Component Value Date   HDL 57 01/26/2021   Lab Results  Component Value Date   LDLCALC 99 01/26/2021   Lab Results  Component Value Date   TRIG 57 01/26/2021   Lab Results  Component Value Date   CHOLHDL 2.9 01/26/2021   Lab Results  Component Value Date   HGBA1C 9.7 (H) 01/26/2021      Assessment & Plan:   Problem List Items Addressed This Visit       Cardiovascular and Mediastinum   Essential hypertension (Chronic)     Patient denies any chest pain or shortness of breath there is no history of palpitation or paroxysmal nocturnal dyspnea   patient was advised to follow low-salt low-cholesterol diet    ideally I want to keep systolic blood pressure below 621130 mmHg, patient was asked to check blood pressure one times a week and give me a report on that.  Patient will be follow-up in 3 months  or earlier as needed, patient will call me back for any change in the cardiovascular symptoms Patient was advised to buy a book from local bookstore concerning blood pressure and read several chapters  every day.  This will be supplemented by some of the material we will give him from the office.  Patient should also utilize other resources like YouTube and Internet to learn more about the blood pressure and the diet.      STEMI (ST  elevation myocardial infarction) [facility](HCC)    Patient has a stent placed recently in [facility]        Endocrine   Diabetes mellitus (HCC) - Primary    - The patient's blood sugar is labile on med. - The patient will continue the current treatment regimen.  - I encouraged the patient to regularly check blood sugar.  - I encouraged the patient to monitor diet. I encouraged the patient to eat low-carb and low-sugar to help prevent blood sugar spikes.  - I encouraged the patient to continue following their prescribed treatment plan for diabetes - I informed the patient to get help if blood sugar drops below 54mg /dL, or if suddenly have trouble thinking clearly or breathing.  Patient was advised to buy a book on diabetes from a local bookstore or from [employer].  Patient should read 2 chapters every day to keep the motivation going, this is in addition to some of the materials we provided them from the office.  There are other resources on the Internet like YouTube and [employer] to get an education on the diabetes      Relevant Orders   POCT glucose (manual entry) (Completed)     Musculoskeletal and Integument   Primary osteoarthritis involving multiple joints    Stable at the present time        Other   Tobacco dependence (Chronic)    Patient has quit smoking      Neck pain    Stable at the  present time       No orders of the defined types were placed in this encounter.   Follow-up: No follow-ups on file.    Corky Downs, MD

## 2021-02-28 NOTE — Assessment & Plan Note (Signed)
Stable at the present time. 

## 2021-02-28 NOTE — Assessment & Plan Note (Signed)

## 2021-02-28 NOTE — Assessment & Plan Note (Signed)
Patient has quit smoking ?

## 2021-03-25 ENCOUNTER — Other Ambulatory Visit: Payer: Self-pay | Admitting: Obstetrics and Gynecology

## 2021-03-28 ENCOUNTER — Ambulatory Visit (INDEPENDENT_AMBULATORY_CARE_PROVIDER_SITE_OTHER): Payer: 59 | Admitting: Internal Medicine

## 2021-03-28 ENCOUNTER — Other Ambulatory Visit: Payer: Self-pay

## 2021-03-28 ENCOUNTER — Encounter: Payer: Self-pay | Admitting: Internal Medicine

## 2021-03-28 VITALS — BP 130/67 | HR 61 | Ht 64.0 in | Wt 160.7 lb

## 2021-03-28 DIAGNOSIS — I10 Essential (primary) hypertension: Secondary | ICD-10-CM

## 2021-03-28 DIAGNOSIS — E1169 Type 2 diabetes mellitus with other specified complication: Secondary | ICD-10-CM | POA: Diagnosis not present

## 2021-03-28 DIAGNOSIS — M159 Polyosteoarthritis, unspecified: Secondary | ICD-10-CM

## 2021-03-28 DIAGNOSIS — I2510 Atherosclerotic heart disease of native coronary artery without angina pectoris: Secondary | ICD-10-CM | POA: Diagnosis not present

## 2021-03-28 DIAGNOSIS — F17200 Nicotine dependence, unspecified, uncomplicated: Secondary | ICD-10-CM

## 2021-03-28 LAB — GLUCOSE, POCT (MANUAL RESULT ENTRY): POC Glucose: 188 mg/dl — AB (ref 70–99)

## 2021-03-28 NOTE — Progress Notes (Signed)
Established Patient Office Visit  Subjective:  Patient ID: Elizabeth Hardin, female    DOB: [birth year]  Age: 61 y.o. MRN: [id number]  CC:  Chief Complaint  Patient presents with   Diabetes    1 month follow up    Diabetes   Elizabeth Hardin presents for check up  Past Medical History:  Diagnosis Date   Coronary artery disease    Diabetes mellitus without complication (HCC)    Hyperlipidemia    Hypertension    Neck pain     Past Surgical History:  Procedure Laterality Date   CORONARY STENT INTERVENTION N/A 02/15/2021   Procedure: CORONARY STENT INTERVENTION;  Surgeon: Marcina Millard, MD;  Location: [facility];  Service: Cardiovascular;  Laterality: N/A;   CORONARY/GRAFT ACUTE MI REVASCULARIZATION N/A 01/26/2021   Procedure: Coronary/Graft Acute MI Revascularization;  Surgeon: Marcina Millard, MD;  Location: [facility];  Service: Cardiovascular;  Laterality: N/A;   LEFT HEART CATH AND CORONARY ANGIOGRAPHY N/A 01/26/2021   Procedure: LEFT HEART CATH AND CORONARY ANGIOGRAPHY;  Surgeon: Marcina Millard, MD;  Location: [facility];  Service: Cardiovascular;  Laterality: N/A;    Family History  Problem Relation Age of Onset   CAD Mother    CAD Father    CAD Brother     Social History   Socioeconomic History   Marital status: Married    Spouse name: Not on file   Number of children: Not on file   Years of education: Not on file   Highest education level: Not on file  Occupational History   Occupation: packing boxes  Tobacco Use   Smoking status: Former    Packs/day: 1.00    Years: 45.00    Pack years: 45.00    Types: Cigarettes    Quit date: 01/26/2021    Years since quitting: 0.1   Smokeless tobacco: Never  Substance and Sexual Activity   Alcohol use: Not Currently   Drug use: Never   Sexual activity: Yes  Other Topics Concern   Not on file  Social History Narrative   Not on file   Social Determinants of Health    Financial Resource Strain: Not on file  Food Insecurity: Not on file  Transportation Needs: Not on file  Physical Activity: Not on file  Stress: Not on file  Social Connections: Not on file  Intimate Partner Violence: Not on file     Current Outpatient Medications:    aspirin 81 MG chewable tablet, Chew 1 tablet (81 mg total) by mouth daily., Disp: 90 tablet, Rfl: 1   atorvastatin (LIPITOR) 80 MG tablet, Take 1 tablet (80 mg total) by mouth daily., Disp: 90 tablet, Rfl: 1   cyclobenzaprine (FLEXERIL) 5 MG tablet, Take 1 tablet (5 mg total) by mouth 3 (three) times daily as needed for muscle spasms., Disp: 90 tablet, Rfl: 1   empagliflozin (JARDIANCE) 25 MG TABS tablet, Take 1 tablet (25 mg total) by mouth daily before breakfast., Disp: 30 tablet, Rfl: 6   glimepiride (AMARYL) 2 MG tablet, Take 1 tablet (2 mg total) by mouth daily before breakfast., Disp: 30 tablet, Rfl: 3   lisinopril (ZESTRIL) 5 MG tablet, Take 1 tablet (5 mg total) by mouth daily., Disp: 30 tablet, Rfl: 2   metoprolol tartrate (LOPRESSOR) 25 MG tablet, Take 1 tablet (25 mg total) by mouth 2 (two) times daily., Disp: 60 tablet, Rfl: 1   nicotine (NICODERM CQ - DOSED IN MG/24 HOURS) 14 mg/24hr  patch, Place 1 patch (14 mg total) onto the skin daily., Disp: 28 patch, Rfl: 0   nicotine polacrilex (NICORETTE) 2 MG gum, Take 1 each (2 mg total) by mouth as needed for smoking cessation., Disp: 100 tablet, Rfl: 0   nitroGLYCERIN (NITROSTAT) 0.4 MG SL tablet, Place 1 tablet (0.4 mg total) under the tongue every 5 (five) minutes as needed for chest pain., Disp: 30 tablet, Rfl: 12   ticagrelor (BRILINTA) 90 MG TABS tablet, Take 1 tablet (90 mg total) by mouth 2 (two) times daily., Disp: 60 tablet, Rfl: 1   Allergies  Allergen Reactions   Metformin And Related Diarrhea    ROS Review of Systems  Constitutional: Negative.   HENT: Negative.    Eyes: Negative.   Respiratory: Negative.    Cardiovascular: Negative.    Gastrointestinal: Negative.   Endocrine: Negative.   Genitourinary: Negative.   Musculoskeletal: Negative.   Skin: Negative.   Allergic/Immunologic: Negative.   Neurological: Negative.   Hematological: Negative.   Psychiatric/Behavioral: Negative.    All other systems reviewed and are negative.    Objective:    Physical Exam Vitals reviewed.  Constitutional:      Appearance: Normal appearance.  HENT:     Mouth/Throat:     Mouth: Mucous membranes are moist.  Eyes:     Pupils: Pupils are equal, round, and reactive to light.  Neck:     Vascular: No carotid bruit.  Cardiovascular:     Rate and Rhythm: Normal rate and regular rhythm.     Pulses: Normal pulses.     Heart sounds: Normal heart sounds.  Pulmonary:     Effort: Pulmonary effort is normal.     Breath sounds: Normal breath sounds.  Abdominal:     General: Bowel sounds are normal.     Palpations: Abdomen is soft. There is no hepatomegaly, splenomegaly or mass.     Tenderness: There is no abdominal tenderness.     Hernia: No hernia is present.  Musculoskeletal:        General: No tenderness.     Cervical back: Neck supple.     Right lower leg: No edema.     Left lower leg: No edema.  Skin:    Findings: No rash.  Neurological:     Mental Status: She is alert and oriented to person, place, and time.     Motor: No weakness.  Psychiatric:        Mood and Affect: Mood and affect normal.        Behavior: Behavior normal.    BP 130/67    Pulse 61    Ht 5\' 4"  (1.626 m)    Wt 160 lb 11.2 oz (72.9 kg)    BMI 27.58 kg/m  Wt Readings from Last 3 Encounters:  03/28/21 160 lb 11.2 oz (72.9 kg)  02/28/21 141 lb (64 kg)  02/15/21 146 lb (66.2 kg)     Health Maintenance Due  Topic Date Due   COVID-19 Vaccine (1) Never done   OPHTHALMOLOGY EXAM  Never done   Hepatitis C Screening  Never done   PAP SMEAR-Modifier  Never done   MAMMOGRAM  Never done    There are no preventive care reminders to display for this  patient.  Lab Results  Component Value Date   TSH 1.470 01/26/2021   Lab Results  Component Value Date   WBC 6.4 02/16/2021   HGB 11.6 (L) 02/16/2021   HCT 35.4 (L) 02/16/2021  MCV 85.5 02/16/2021   PLT 209 02/16/2021   Lab Results  Component Value Date   NA 137 02/16/2021   K 4.8 02/16/2021   CO2 21 (L) 02/16/2021   GLUCOSE 244 (H) 02/16/2021   BUN 25 (H) 02/16/2021   CREATININE 0.91 02/16/2021   BILITOT 1.0 11/23/2020   ALKPHOS 55 11/23/2020   AST 15 11/23/2020   ALT 15 11/23/2020   PROT 7.3 11/23/2020   ALBUMIN 4.3 11/23/2020   CALCIUM 8.8 (L) 02/16/2021   ANIONGAP 8 02/16/2021   Lab Results  Component Value Date   CHOL 167 01/26/2021   Lab Results  Component Value Date   HDL 57 01/26/2021   Lab Results  Component Value Date   LDLCALC 99 01/26/2021   Lab Results  Component Value Date   TRIG 57 01/26/2021   Lab Results  Component Value Date   CHOLHDL 2.9 01/26/2021   Lab Results  Component Value Date   HGBA1C 9.7 (H) 01/26/2021      Assessment & Plan:   Problem List Items Addressed This Visit       Cardiovascular and Mediastinum   Essential hypertension (Chronic)     Patient denies any chest pain or shortness of breath there is no history of palpitation or paroxysmal nocturnal dyspnea   patient was advised to follow low-salt low-cholesterol diet    ideally I want to keep systolic blood pressure below 546 mmHg, patient was asked to check blood pressure one times a week and give me a report on that.  Patient will be follow-up in 3 months  or earlier as needed, patient will call me back for any change in the cardiovascular symptoms Patient was advised to buy a book from local bookstore concerning blood pressure and read several chapters  every day.  This will be supplemented by some of the material we will give him from the office.  Patient should also utilize other resources like YouTube and Internet to learn more about the blood pressure and the  diet.      CAD (coronary artery disease)    Patient has a heart attack earlier this year she not having any angina chest pain or arrhythmia.  Heart is regular chest is clear abdomen is soft nontender.  She does not have any carotid bruit.        Endocrine   Diabetes mellitus (HCC) - Primary    - The patient's blood sugar is labile on med. - The patient will continue the current treatment regimen.  - I encouraged the patient to regularly check blood sugar.  - I encouraged the patient to monitor diet. I encouraged the patient to eat low-carb and low-sugar to help prevent blood sugar spikes.  - I encouraged the patient to continue following their prescribed treatment plan for diabetes - I informed the patient to get help if blood sugar drops below 54mg /dL, or if suddenly have trouble thinking clearly or breathing.  Patient was advised to buy a book on diabetes from a local bookstore or from .  Patient should read 2 chapters every day to keep the motivation going, this is in addition to some of the materials we provided them from the office.  There are other resources on the Internet like YouTube and wilkipedia to get an education on the diabetes      Relevant Orders   POCT glucose (manual entry) (Completed)     Musculoskeletal and Integument   Primary osteoarthritis involving multiple joints  Chronic problem I advised the patient to take vitamin D 1000 units every day        Other   Tobacco dependence (Chronic)    - I instructed the patient to stop smoking and provided them with smoking cessation materials.  - I informed the patient that smoking puts them at increased risk for cancer, COPD, hypertension, and more.  - Informed the patient to seek help if they begin to have trouble breathing, develop chest pain, start to cough up blood, feel faint, or pass out.       No orders of the defined types were placed in this encounter.   Follow-up: No follow-ups on file.     Corky DownsJaved Kage Willmann, MD

## 2021-03-28 NOTE — Assessment & Plan Note (Signed)

## 2021-03-28 NOTE — Assessment & Plan Note (Signed)
Chronic problem I advised the patient to take vitamin D 1000 units every day ?

## 2021-03-28 NOTE — Assessment & Plan Note (Signed)

## 2021-03-28 NOTE — Assessment & Plan Note (Signed)
-   I instructed the patient to stop smoking and provided them with smoking cessation materials.  - I informed the patient that smoking puts them at increased risk for cancer, COPD, hypertension, and more.  - Informed the patient to seek help if they begin to have trouble breathing, develop chest pain, start to cough up blood, feel faint, or pass out.  

## 2021-03-28 NOTE — Assessment & Plan Note (Signed)
Patient has a heart attack earlier this year she not having any angina chest pain or arrhythmia.  Heart is regular chest is clear abdomen is soft nontender.  She does not have any carotid bruit. ?

## 2021-06-04 ENCOUNTER — Ambulatory Visit (INDEPENDENT_AMBULATORY_CARE_PROVIDER_SITE_OTHER): Payer: 59 | Admitting: Internal Medicine

## 2021-06-04 ENCOUNTER — Encounter: Payer: Self-pay | Admitting: Internal Medicine

## 2021-06-04 VITALS — BP 93/55 | HR 67 | Ht 64.0 in | Wt 153.6 lb

## 2021-06-04 DIAGNOSIS — Z20822 Contact with and (suspected) exposure to covid-19: Secondary | ICD-10-CM

## 2021-06-04 DIAGNOSIS — I10 Essential (primary) hypertension: Secondary | ICD-10-CM | POA: Diagnosis not present

## 2021-06-04 DIAGNOSIS — E1169 Type 2 diabetes mellitus with other specified complication: Secondary | ICD-10-CM

## 2021-06-04 DIAGNOSIS — I2510 Atherosclerotic heart disease of native coronary artery without angina pectoris: Secondary | ICD-10-CM | POA: Diagnosis not present

## 2021-06-04 DIAGNOSIS — M542 Cervicalgia: Secondary | ICD-10-CM

## 2021-06-04 DIAGNOSIS — J028 Acute pharyngitis due to other specified organisms: Secondary | ICD-10-CM | POA: Diagnosis not present

## 2021-06-04 LAB — POC COVID19 BINAXNOW: SARS Coronavirus 2 Ag: NEGATIVE

## 2021-06-04 MED ORDER — AMOXICILLIN-POT CLAVULANATE 875-125 MG PO TABS: 1.0000 | ORAL_TABLET | Freq: Two times a day (BID) | ORAL | 0 refills | Status: DC

## 2021-06-04 NOTE — Assessment & Plan Note (Signed)

## 2021-06-04 NOTE — Assessment & Plan Note (Signed)
Specimen negative for COVID ?

## 2021-06-04 NOTE — Assessment & Plan Note (Signed)
Due to sore throat there is no evidence of adenitis ?

## 2021-06-04 NOTE — Progress Notes (Signed)
? ?Established Patient Office Visit ? ?Subjective:  ?Patient ID: Elizabeth PintoWendy L Hardin, female    DOB: 1960-09-09  Age: 61 y.o. MRN: [id number] ? ?CC:  ?Chief Complaint  ?Patient presents with  ? Sore Throat  ?  Patient complains of sore throat for a couple of weeks. On Friday morning patient woke up with very sore throat and swelling, difficulty swallowing and patient has lost her voice.  ? ? ?Sore Throat  ? ? ?Elizabeth PintoWendy L Hardin presents for SORE THROAT ? ?Past Medical History:  ?Diagnosis Date  ? Coronary artery disease   ? Diabetes mellitus without complication (HCC)   ? Hyperlipidemia   ? Hypertension   ? Neck pain   ? ? ?Past Surgical History:  ?Procedure Laterality Date  ? CORONARY STENT INTERVENTION N/A 02/15/2021  ? Procedure: CORONARY STENT INTERVENTION;  Surgeon: Marcina MillardParaschos, Alexander, MD;  Location: [facility];  Service: Cardiovascular;  Laterality: N/A;  ? CORONARY/GRAFT ACUTE MI REVASCULARIZATION N/A 01/26/2021  ? Procedure: Coronary/Graft Acute MI Revascularization;  Surgeon: Marcina MillardParaschos, Alexander, MD;  Location: [facility];  Service: Cardiovascular;  Laterality: N/A;  ? LEFT HEART CATH AND CORONARY ANGIOGRAPHY N/A 01/26/2021  ? Procedure: LEFT HEART CATH AND CORONARY ANGIOGRAPHY;  Surgeon: Marcina MillardParaschos, Alexander, MD;  Location: [facility];  Service: Cardiovascular;  Laterality: N/A;  ? ? ?Family History  ?Problem Relation Age of Onset  ? CAD Mother   ? CAD Father   ? CAD Brother   ? ? ?Social History  ? ?Socioeconomic History  ? Marital status: Married  ?  Spouse name: Not on file  ? Number of children: Not on file  ? Years of education: Not on file  ? Highest education level: Not on file  ?Occupational History  ? Occupation: packing boxes  ?Tobacco Use  ? Smoking status: Former  ?  Packs/day: 1.00  ?  Years: 45.00  ?  Pack years: 45.00  ?  Types: Cigarettes  ?  Quit date: 01/26/2021  ?  Years since quitting: 0.3  ? Smokeless tobacco: Never  ?Substance and Sexual Activity  ? Alcohol use:  Not Currently  ? Drug use: Never  ? Sexual activity: Yes  ?Other Topics Concern  ? Not on file  ?Social History Narrative  ? Not on file  ? ?Social Determinants of Health  ? ?Financial Resource Strain: Not on file  ?Food Insecurity: Not on file  ?Transportation Needs: Not on file  ?Physical Activity: Not on file  ?Stress: Not on file  ?Social Connections: Not on file  ?Intimate Partner Violence: Not on file  ? ? ? ?Current Outpatient Medications:  ?  aspirin 81 MG chewable tablet, Chew 1 tablet (81 mg total) by mouth daily., Disp: 90 tablet, Rfl: 1 ?  atorvastatin (LIPITOR) 80 MG tablet, Take 1 tablet (80 mg total) by mouth daily., Disp: 90 tablet, Rfl: 1 ?  cyclobenzaprine (FLEXERIL) 5 MG tablet, Take 1 tablet (5 mg total) by mouth 3 (three) times daily as needed for muscle spasms., Disp: 90 tablet, Rfl: 1 ?  empagliflozin (JARDIANCE) 25 MG TABS tablet, Take 1 tablet (25 mg total) by mouth daily before breakfast., Disp: 30 tablet, Rfl: 6 ?  glimepiride (AMARYL) 2 MG tablet, Take 1 tablet (2 mg total) by mouth daily before breakfast., Disp: 30 tablet, Rfl: 3 ?  lisinopril (ZESTRIL) 5 MG tablet, Take 1 tablet (5 mg total) by mouth daily., Disp: 30 tablet, Rfl: 2 ?  metoprolol tartrate (LOPRESSOR) 25 MG tablet,  Take 1 tablet (25 mg total) by mouth 2 (two) times daily., Disp: 60 tablet, Rfl: 1 ?  nitroGLYCERIN (NITROSTAT) 0.4 MG SL tablet, Place 1 tablet (0.4 mg total) under the tongue every 5 (five) minutes as needed for chest pain., Disp: 30 tablet, Rfl: 12 ?  ticagrelor (BRILINTA) 90 MG TABS tablet, Take 1 tablet (90 mg total) by mouth 2 (two) times daily., Disp: 60 tablet, Rfl: 1  ? ?Allergies  ?Allergen Reactions  ? Metformin And Related Diarrhea  ? ? ?ROS ?Review of Systems  ?Constitutional: Negative.   ?HENT: Negative.    ?Eyes: Negative.   ?Respiratory: Negative.    ?Cardiovascular: Negative.   ?Gastrointestinal: Negative.   ?Endocrine: Negative.   ?Genitourinary: Negative.   ?Musculoskeletal: Negative.    ?Skin: Negative.   ?Allergic/Immunologic: Negative.   ?Neurological: Negative.   ?Hematological: Negative.   ?Psychiatric/Behavioral: Negative.    ?All other systems reviewed and are negative. ? ?  ?Objective:  ?  ?Physical Exam ?Vitals reviewed.  ?Constitutional:   ?   Appearance: Normal appearance.  ?HENT:  ?   Head: Normocephalic and atraumatic.  ?   Left Ear: No swelling or tenderness. Tympanic membrane is not erythematous.  ?   Mouth/Throat:  ?   Mouth: Mucous membranes are moist.  ?Eyes:  ?   Pupils: Pupils are equal, round, and reactive to light.  ?Neck:  ?   Vascular: No carotid bruit.  ?Cardiovascular:  ?   Rate and Rhythm: Normal rate and regular rhythm.  ?   Pulses: Normal pulses.  ?   Heart sounds: Normal heart sounds.  ?Pulmonary:  ?   Effort: Pulmonary effort is normal.  ?   Breath sounds: Normal breath sounds.  ?Abdominal:  ?   General: Bowel sounds are normal.  ?   Palpations: Abdomen is soft. There is no hepatomegaly, splenomegaly or mass.  ?   Tenderness: There is no abdominal tenderness.  ?   Hernia: No hernia is present.  ?Musculoskeletal:     ?   General: No tenderness.  ?   Cervical back: Neck supple.  ?   Right lower leg: No edema.  ?   Left lower leg: No edema.  ?Skin: ?   Findings: No rash.  ?Neurological:  ?   Mental Status: She is alert and oriented to person, place, and time.  ?   Motor: No weakness.  ?Psychiatric:     ?   Mood and Affect: Mood and affect normal.     ?   Behavior: Behavior normal.  ? ? ?BP (!) 93/55   Pulse 67   Ht 5\' 4"  (1.626 m)   Wt 153 lb 9.6 oz (69.7 kg)   BMI 26.37 kg/m?  ?Wt Readings from Last 3 Encounters:  ?06/04/21 153 lb 9.6 oz (69.7 kg)  ?03/28/21 160 lb 11.2 oz (72.9 kg)  ?02/28/21 141 lb (64 kg)  ? ? ? ?Health Maintenance Due  ?Topic Date Due  ? COVID-19 Vaccine (1) Never done  ? OPHTHALMOLOGY EXAM  Never done  ? Hepatitis C Screening  Never done  ? PAP SMEAR-Modifier  Never done  ? MAMMOGRAM  Never done  ? Zoster Vaccines- Shingrix (1 of 2) Never done   ? ? ?There are no preventive care reminders to display for this patient. ? ?Lab Results  ?Component Value Date  ? TSH 1.470 01/26/2021  ? ?Lab Results  ?Component Value Date  ? WBC 6.4 02/16/2021  ? HGB 11.6 (L) 02/16/2021  ?  HCT 35.4 (L) 02/16/2021  ? MCV 85.5 02/16/2021  ? PLT 209 02/16/2021  ? ?Lab Results  ?Component Value Date  ? NA 137 02/16/2021  ? K 4.8 02/16/2021  ? CO2 21 (L) 02/16/2021  ? GLUCOSE 244 (H) 02/16/2021  ? BUN 25 (H) 02/16/2021  ? CREATININE 0.91 02/16/2021  ? BILITOT 1.0 11/23/2020  ? ALKPHOS 55 11/23/2020  ? AST 15 11/23/2020  ? ALT 15 11/23/2020  ? PROT 7.3 11/23/2020  ? ALBUMIN 4.3 11/23/2020  ? CALCIUM 8.8 (L) 02/16/2021  ? ANIONGAP 8 02/16/2021  ? ?Lab Results  ?Component Value Date  ? CHOL 167 01/26/2021  ? ?Lab Results  ?Component Value Date  ? HDL 57 01/26/2021  ? ?Lab Results  ?Component Value Date  ? LDLCALC 99 01/26/2021  ? ?Lab Results  ?Component Value Date  ? TRIG 57 01/26/2021  ? ?Lab Results  ?Component Value Date  ? CHOLHDL 2.9 01/26/2021  ? ?Lab Results  ?Component Value Date  ? HGBA1C 9.7 (H) 01/26/2021  ? ? ?  ?Assessment & Plan:  ? ?Problem List Items Addressed This Visit   ? ?  ? Cardiovascular and Mediastinum  ? Essential hypertension (Chronic)  ?   Patient denies any chest pain or shortness of breath there is no history of palpitation or paroxysmal nocturnal dyspnea ?  patient was advised to follow low-salt low-cholesterol diet ? ?  ideally I want to keep systolic blood pressure below 448 mmHg, patient was asked to check blood pressure one times a week and give me a report on that.  Patient will be follow-up in 3 months  or earlier as needed, patient will call me back for any change in the cardiovascular symptoms ?Patient was advised to buy a book from local bookstore concerning blood pressure and read several chapters  every day.  This will be supplemented by some of the material we will give him from the office.  Patient should also utilize other resources like  YouTube and Internet to learn more about the blood pressure and the diet. ? ?  ?  ? CAD (coronary artery disease)  ?  Stable at the present time ? ?  ?  ?  ? Respiratory  ? Acute pharyngitis due to other specified

## 2021-06-04 NOTE — Assessment & Plan Note (Signed)
Patient was started on Augmentin 

## 2021-06-04 NOTE — Assessment & Plan Note (Signed)
Stable at the present time. 

## 2021-06-04 NOTE — Assessment & Plan Note (Signed)

## 2021-06-27 ENCOUNTER — Ambulatory Visit: Payer: 59 | Admitting: Internal Medicine

## 2021-07-04 ENCOUNTER — Ambulatory Visit (INDEPENDENT_AMBULATORY_CARE_PROVIDER_SITE_OTHER): Payer: 59 | Admitting: Internal Medicine

## 2021-07-04 ENCOUNTER — Encounter: Payer: Self-pay | Admitting: Internal Medicine

## 2021-07-04 VITALS — BP 122/69 | HR 64 | Ht 64.0 in | Wt 156.9 lb

## 2021-07-04 DIAGNOSIS — F17200 Nicotine dependence, unspecified, uncomplicated: Secondary | ICD-10-CM | POA: Diagnosis not present

## 2021-07-04 DIAGNOSIS — I2510 Atherosclerotic heart disease of native coronary artery without angina pectoris: Secondary | ICD-10-CM | POA: Diagnosis not present

## 2021-07-04 DIAGNOSIS — I10 Essential (primary) hypertension: Secondary | ICD-10-CM

## 2021-07-04 DIAGNOSIS — G8929 Other chronic pain: Secondary | ICD-10-CM

## 2021-07-04 DIAGNOSIS — M25511 Pain in right shoulder: Secondary | ICD-10-CM

## 2021-07-04 DIAGNOSIS — E119 Type 2 diabetes mellitus without complications: Secondary | ICD-10-CM | POA: Diagnosis not present

## 2021-07-04 LAB — GLUCOSE, POCT (MANUAL RESULT ENTRY): POC Glucose: 181 mg/dl — AB (ref 70–99)

## 2021-07-04 MED ORDER — METHOCARBAMOL 500 MG PO TABS: 500.0000 mg | ORAL_TABLET | Freq: Three times a day (TID) | ORAL | 4 refills | Status: DC

## 2021-07-04 NOTE — Assessment & Plan Note (Signed)

## 2021-07-04 NOTE — Assessment & Plan Note (Signed)
Stable at the present time. 

## 2021-07-04 NOTE — Progress Notes (Signed)
Established Patient Office Visit  Subjective:  Patient ID: Elizabeth Hardin, female    DOB: 07-31-1960  Age: 61 y.o. MRN: [id number]  CC:  Chief Complaint  Patient presents with   Diabetes    Diabetes    Elizabeth Hardin presents for general checkup.  Patient denies any chest pain or shortness of breath.  She is known to have coronary artery disease and has a history of stent placement she denies any chest pain her blood pressure is under control diabetes is stable.  Unfortunately she continues to smoke and I told her to quit smoking.  Past Medical History:  Diagnosis Date   Coronary artery disease    Diabetes mellitus without complication (HCC)    Hyperlipidemia    Hypertension    Neck pain     Past Surgical History:  Procedure Laterality Date   CORONARY STENT INTERVENTION N/A 02/15/2021   Procedure: CORONARY STENT INTERVENTION;  Surgeon: Marcina Millard, MD;  Location: [facility];  Service: Cardiovascular;  Laterality: N/A;   CORONARY/GRAFT ACUTE MI REVASCULARIZATION N/A 01/26/2021   Procedure: Coronary/Graft Acute MI Revascularization;  Surgeon: Marcina Millard, MD;  Location: [facility];  Service: Cardiovascular;  Laterality: N/A;   LEFT HEART CATH AND CORONARY ANGIOGRAPHY N/A 01/26/2021   Procedure: LEFT HEART CATH AND CORONARY ANGIOGRAPHY;  Surgeon: Marcina Millard, MD;  Location: [facility];  Service: Cardiovascular;  Laterality: N/A;    Family History  Problem Relation Age of Onset   CAD Mother    CAD Father    CAD Brother     Social History   Socioeconomic History   Marital status: Married    Spouse name: Not on file   Number of children: Not on file   Years of education: Not on file   Highest education level: Not on file  Occupational History   Occupation: packing boxes  Tobacco Use   Smoking status: Former    Packs/day: 1.00    Years: 45.00    Total pack years: 45.00    Types: Cigarettes    Quit date:  01/26/2021    Years since quitting: 0.4   Smokeless tobacco: Never  Substance and Sexual Activity   Alcohol use: Not Currently   Drug use: Never   Sexual activity: Yes  Other Topics Concern   Not on file  Social History Narrative   Not on file   Social Determinants of Health   Financial Resource Strain: Not on file  Food Insecurity: Not on file  Transportation Needs: Not on file  Physical Activity: Not on file  Stress: Not on file  Social Connections: Not on file  Intimate Partner Violence: Not on file     Current Outpatient Medications:    aspirin 81 MG chewable tablet, Chew 1 tablet (81 mg total) by mouth daily., Disp: 90 tablet, Rfl: 1   atorvastatin (LIPITOR) 80 MG tablet, Take 1 tablet (80 mg total) by mouth daily., Disp: 90 tablet, Rfl: 1   cyclobenzaprine (FLEXERIL) 5 MG tablet, Take 1 tablet (5 mg total) by mouth 3 (three) times daily as needed for muscle spasms., Disp: 90 tablet, Rfl: 1   empagliflozin (JARDIANCE) 25 MG TABS tablet, Take 1 tablet (25 mg total) by mouth daily before breakfast., Disp: 30 tablet, Rfl: 6   glimepiride (AMARYL) 2 MG tablet, Take 1 tablet (2 mg total) by mouth daily before breakfast., Disp: 30 tablet, Rfl: 3   lisinopril (ZESTRIL) 5 MG tablet, Take 1 tablet (  5 mg total) by mouth daily., Disp: 30 tablet, Rfl: 2   metoprolol tartrate (LOPRESSOR) 25 MG tablet, Take 1 tablet (25 mg total) by mouth 2 (two) times daily., Disp: 60 tablet, Rfl: 1   nitroGLYCERIN (NITROSTAT) 0.4 MG SL tablet, Place 1 tablet (0.4 mg total) under the tongue every 5 (five) minutes as needed for chest pain., Disp: 30 tablet, Rfl: 12   ticagrelor (BRILINTA) 90 MG TABS tablet, Take 1 tablet (90 mg total) by mouth 2 (two) times daily., Disp: 60 tablet, Rfl: 1   methocarbamol (ROBAXIN) 500 MG tablet, Take 1 tablet (500 mg total) by mouth 3 (three) times daily., Disp: 90 tablet, Rfl: 4   Allergies  Allergen Reactions   Metformin And Related Diarrhea    ROS Review of Systems   Constitutional: Negative.   HENT: Negative.    Eyes: Negative.   Respiratory: Negative.    Cardiovascular: Negative.   Gastrointestinal: Negative.   Endocrine: Negative.   Genitourinary: Negative.   Musculoskeletal: Negative.   Skin: Negative.   Allergic/Immunologic: Negative.   Neurological: Negative.   Hematological: Negative.   Psychiatric/Behavioral: Negative.    All other systems reviewed and are negative.     Objective:    Physical Exam Vitals reviewed.  Constitutional:      Appearance: Normal appearance.  HENT:     Mouth/Throat:     Mouth: Mucous membranes are moist.  Eyes:     Pupils: Pupils are equal, round, and reactive to light.  Neck:     Vascular: No carotid bruit.  Cardiovascular:     Rate and Rhythm: Normal rate and regular rhythm.     Pulses: Normal pulses.     Heart sounds: Normal heart sounds.  Pulmonary:     Effort: Pulmonary effort is normal.     Breath sounds: Normal breath sounds.  Abdominal:     General: Bowel sounds are normal.     Palpations: Abdomen is soft. There is no hepatomegaly, splenomegaly or mass.     Tenderness: There is no abdominal tenderness.     Hernia: No hernia is present.  Musculoskeletal:        General: No tenderness.     Cervical back: Neck supple.     Right lower leg: No edema.     Left lower leg: No edema.  Skin:    Findings: No rash.  Neurological:     Mental Status: She is alert and oriented to person, place, and time.     Motor: No weakness.  Psychiatric:        Mood and Affect: Mood and affect normal.        Behavior: Behavior normal.     BP 122/69   Pulse 64   Ht 5\' 4"  (1.626 m)   Wt 156 lb 14.4 oz (71.2 kg)   BMI 26.93 kg/m  Wt Readings from Last 3 Encounters:  07/04/21 156 lb 14.4 oz (71.2 kg)  06/04/21 153 lb 9.6 oz (69.7 kg)  03/28/21 160 lb 11.2 oz (72.9 kg)     Health Maintenance Due  Topic Date Due   COVID-19 Vaccine (1) Never done   OPHTHALMOLOGY EXAM  Never done   Hepatitis C  Screening  Never done   PAP SMEAR-Modifier  Never done   MAMMOGRAM  Never done   Zoster Vaccines- Shingrix (1 of 2) Never done    There are no preventive care reminders to display for this patient.  Lab Results  Component Value Date  TSH 1.470 01/26/2021   Lab Results  Component Value Date   WBC 6.4 02/16/2021   HGB 11.6 (L) 02/16/2021   HCT 35.4 (L) 02/16/2021   MCV 85.5 02/16/2021   PLT 209 02/16/2021   Lab Results  Component Value Date   NA 137 02/16/2021   K 4.8 02/16/2021   CO2 21 (L) 02/16/2021   GLUCOSE 244 (H) 02/16/2021   BUN 25 (H) 02/16/2021   CREATININE 0.91 02/16/2021   BILITOT 1.0 11/23/2020   ALKPHOS 55 11/23/2020   AST 15 11/23/2020   ALT 15 11/23/2020   PROT 7.3 11/23/2020   ALBUMIN 4.3 11/23/2020   CALCIUM 8.8 (L) 02/16/2021   ANIONGAP 8 02/16/2021   Lab Results  Component Value Date   CHOL 167 01/26/2021   Lab Results  Component Value Date   HDL 57 01/26/2021   Lab Results  Component Value Date   LDLCALC 99 01/26/2021   Lab Results  Component Value Date   TRIG 57 01/26/2021   Lab Results  Component Value Date   CHOLHDL 2.9 01/26/2021   Lab Results  Component Value Date   HGBA1C 9.7 (H) 01/26/2021      Assessment & Plan:   Problem List Items Addressed This Visit       Cardiovascular and Mediastinum   Essential hypertension (Chronic)    Stable at the present time      CAD (coronary artery disease)    The following hypertensive lifestyle modification were recommended and discussed:  1. Limiting alcohol intake to less than 1 oz/day of ethanol:(24 oz of beer or 8 oz of wine or 2 oz of 100-proof whiskey). 2. Take baby ASA 81 mg daily. 3. Importance of regular aerobic exercise and losing weight. 4. Reduce dietary saturated fat and cholesterol intake for overall cardiovascular health. 5. Maintaining adequate dietary potassium, calcium, and magnesium intake. 6. Regular monitoring of the blood pressure. 7. Reduce sodium intake  to less than 100 mmol/day (less than 2.3 gm of sodium or less than 6 gm of sodium choride)         Endocrine   Diabetes mellitus (HCC) - Primary    - The patient's blood sugar is labile on med. - The patient will continue the current treatment regimen.  - I encouraged the patient to regularly check blood sugar.  - I encouraged the patient to monitor diet. I encouraged the patient to eat low-carb and low-sugar to help prevent blood sugar spikes.  - I encouraged the patient to continue following their prescribed treatment plan for diabetes - I informed the patient to get help if blood sugar drops below 54mg /dL, or if suddenly have trouble thinking clearly or breathing.  Patient was advised to buy a book on diabetes from a local bookstore or from .  Patient should read 2 chapters every day to keep the motivation going, this is in addition to some of the materials we provided them from the office.  There are other resources on the Internet like YouTube and wilkipedia to get an education on the diabetes      Relevant Orders   POCT glucose (manual entry) (Completed)     Other   Tobacco dependence (Chronic)    - I instructed the patient to stop smoking and provided them with smoking cessation materials.  - I informed the patient that smoking puts them at increased risk for cancer, COPD, hypertension, and more.  - Informed the patient to seek help if they begin to have  trouble breathing, develop chest pain, start to cough up blood, feel faint, or pass out.       Meds ordered this encounter  Medications   methocarbamol (ROBAXIN) 500 MG tablet    Sig: Take 1 tablet (500 mg total) by mouth 3 (three) times daily.    Dispense:  90 tablet    Refill:  4    Follow-up: No follow-ups on file.    Corky Downs, MD

## 2021-07-04 NOTE — Assessment & Plan Note (Signed)

## 2021-07-04 NOTE — Assessment & Plan Note (Signed)
-   I instructed the patient to stop smoking and provided them with smoking cessation materials.  - I informed the patient that smoking puts them at increased risk for cancer, COPD, hypertension, and more.  - Informed the patient to seek help if they begin to have trouble breathing, develop chest pain, start to cough up blood, feel faint, or pass out.  

## 2021-07-17 ENCOUNTER — Other Ambulatory Visit: Payer: Self-pay | Admitting: *Deleted

## 2021-07-17 DIAGNOSIS — E1169 Type 2 diabetes mellitus with other specified complication: Secondary | ICD-10-CM

## 2021-07-17 MED ORDER — GLIMEPIRIDE 2 MG PO TABS: 2.0000 mg | ORAL_TABLET | Freq: Every day | ORAL | 3 refills | Status: DC

## 2021-08-19 ENCOUNTER — Other Ambulatory Visit: Payer: Self-pay | Admitting: Obstetrics and Gynecology

## 2021-08-28 ENCOUNTER — Encounter: Payer: Self-pay | Admitting: Internal Medicine

## 2021-08-28 ENCOUNTER — Ambulatory Visit (INDEPENDENT_AMBULATORY_CARE_PROVIDER_SITE_OTHER): Payer: 59 | Admitting: Internal Medicine

## 2021-08-28 VITALS — BP 100/61 | HR 62 | Ht 64.0 in | Wt 153.3 lb

## 2021-08-28 DIAGNOSIS — E119 Type 2 diabetes mellitus without complications: Secondary | ICD-10-CM

## 2021-08-28 DIAGNOSIS — R6883 Chills (without fever): Secondary | ICD-10-CM | POA: Diagnosis not present

## 2021-08-28 DIAGNOSIS — I2510 Atherosclerotic heart disease of native coronary artery without angina pectoris: Secondary | ICD-10-CM

## 2021-08-28 DIAGNOSIS — Z20822 Contact with and (suspected) exposure to covid-19: Secondary | ICD-10-CM | POA: Diagnosis not present

## 2021-08-28 DIAGNOSIS — F17200 Nicotine dependence, unspecified, uncomplicated: Secondary | ICD-10-CM

## 2021-08-28 DIAGNOSIS — I10 Essential (primary) hypertension: Secondary | ICD-10-CM

## 2021-08-28 LAB — GLUCOSE, POCT (MANUAL RESULT ENTRY): POC Glucose: 123 mg/dL — AB (ref 70–99)

## 2021-08-28 LAB — POC COVID19 BINAXNOW: SARS Coronavirus 2 Ag: POSITIVE — AB

## 2021-09-03 ENCOUNTER — Ambulatory Visit: Payer: 59 | Admitting: Internal Medicine

## 2021-09-05 ENCOUNTER — Ambulatory Visit (INDEPENDENT_AMBULATORY_CARE_PROVIDER_SITE_OTHER): Payer: 59 | Admitting: *Deleted

## 2021-09-05 DIAGNOSIS — U071 COVID-19: Secondary | ICD-10-CM | POA: Diagnosis not present

## 2021-09-05 LAB — POC COVID19 BINAXNOW: SARS Coronavirus 2 Ag: NEGATIVE

## 2021-09-09 ENCOUNTER — Encounter: Payer: Self-pay | Admitting: Internal Medicine

## 2021-09-09 NOTE — Assessment & Plan Note (Signed)
Blood sugar is under control ?

## 2021-09-09 NOTE — Assessment & Plan Note (Signed)
No chest pain heart is regular chest is clear

## 2021-09-09 NOTE — Progress Notes (Signed)
Established Patient Office Visit  Subjective:  Patient ID: Elizabeth Hardin, female    DOB: 1961/01/03  Age: 61 y.o. MRN: [id number]  CC:  Chief Complaint  Patient presents with   Chills    Patient has had chills and body aches since last wed. Patient denies any fever, sore throat, cough.     HPI  [name] presents for general checkup  Past Medical History:  Diagnosis Date   Coronary artery disease    Diabetes mellitus without complication (HCC)    Hyperlipidemia    Hypertension    Neck pain     Past Surgical History:  Procedure Laterality Date   CORONARY STENT INTERVENTION N/A 02/15/2021   Procedure: CORONARY STENT INTERVENTION;  Surgeon: Marcina Millard, MD;  Location: [facility];  Service: Cardiovascular;  Laterality: N/A;   CORONARY/GRAFT ACUTE MI REVASCULARIZATION N/A 01/26/2021   Procedure: Coronary/Graft Acute MI Revascularization;  Surgeon: Marcina Millard, MD;  Location: [facility];  Service: Cardiovascular;  Laterality: N/A;   LEFT HEART CATH AND CORONARY ANGIOGRAPHY N/A 01/26/2021   Procedure: LEFT HEART CATH AND CORONARY ANGIOGRAPHY;  Surgeon: Marcina Millard, MD;  Location: [facility];  Service: Cardiovascular;  Laterality: N/A;    Family History  Problem Relation Age of Onset   CAD Mother    CAD Father    CAD Brother     Social History   Socioeconomic History   Marital status: Married    Spouse name: Not on file   Number of children: Not on file   Years of education: Not on file   Highest education level: Not on file  Occupational History   Occupation: packing boxes  Tobacco Use   Smoking status: Former    Packs/day: 1.00    Years: 45.00    Total pack years: 45.00    Types: Cigarettes    Quit date: 01/26/2021    Years since quitting: 0.6   Smokeless tobacco: Never  Substance and Sexual Activity   Alcohol use: Not Currently   Drug use: Never   Sexual activity: Yes  Other Topics Concern    Not on file  Social History Narrative   Not on file   Social Determinants of Health   Financial Resource Strain: Not on file  Food Insecurity: Not on file  Transportation Needs: Not on file  Physical Activity: Not on file  Stress: Not on file  Social Connections: Not on file  Intimate Partner Violence: Not on file     Current Outpatient Medications:    aspirin 81 MG chewable tablet, Chew 1 tablet (81 mg total) by mouth daily., Disp: 90 tablet, Rfl: 1   atorvastatin (LIPITOR) 80 MG tablet, Take 1 tablet (80 mg total) by mouth daily., Disp: 90 tablet, Rfl: 1   cyclobenzaprine (FLEXERIL) 5 MG tablet, Take 1 tablet (5 mg total) by mouth 3 (three) times daily as needed for muscle spasms., Disp: 90 tablet, Rfl: 1   empagliflozin (JARDIANCE) 25 MG TABS tablet, Take 1 tablet (25 mg total) by mouth daily before breakfast., Disp: 30 tablet, Rfl: 6   glimepiride (AMARYL) 2 MG tablet, Take 1 tablet (2 mg total) by mouth daily before breakfast., Disp: 30 tablet, Rfl: 3   lisinopril (ZESTRIL) 5 MG tablet, Take 1 tablet (5 mg total) by mouth daily., Disp: 30 tablet, Rfl: 2   methocarbamol (ROBAXIN) 500 MG tablet, Take 1 tablet (500 mg total) by mouth 3 (three) times daily., Disp: 90 tablet, Rfl:  4   metoprolol tartrate (LOPRESSOR) 25 MG tablet, Take 1 tablet (25 mg total) by mouth 2 (two) times daily., Disp: 60 tablet, Rfl: 1   nitroGLYCERIN (NITROSTAT) 0.4 MG SL tablet, Place 1 tablet (0.4 mg total) under the tongue every 5 (five) minutes as needed for chest pain., Disp: 30 tablet, Rfl: 12   ticagrelor (BRILINTA) 90 MG TABS tablet, Take 1 tablet (90 mg total) by mouth 2 (two) times daily., Disp: 60 tablet, Rfl: 1   Allergies  Allergen Reactions   Metformin And Related Diarrhea    ROS Review of Systems  Constitutional: Negative.   HENT: Negative.    Eyes: Negative.   Respiratory: Negative.    Cardiovascular: Negative.   Gastrointestinal: Negative.   Endocrine: Negative.   Genitourinary:  Negative.   Musculoskeletal: Negative.   Skin: Negative.   Allergic/Immunologic: Negative.   Neurological: Negative.   Hematological: Negative.   Psychiatric/Behavioral: Negative.    All other systems reviewed and are negative.     Objective:    Physical Exam Vitals reviewed.  Constitutional:      Appearance: Normal appearance.  HENT:     Mouth/Throat:     Mouth: Mucous membranes are moist.  Eyes:     Pupils: Pupils are equal, round, and reactive to light.  Neck:     Vascular: No carotid bruit.  Cardiovascular:     Rate and Rhythm: Normal rate and regular rhythm.     Pulses: Normal pulses.     Heart sounds: Normal heart sounds.  Pulmonary:     Effort: Pulmonary effort is normal.     Breath sounds: Normal breath sounds.  Abdominal:     General: Bowel sounds are normal.     Palpations: Abdomen is soft. There is no hepatomegaly, splenomegaly or mass.     Tenderness: There is no abdominal tenderness.     Hernia: No hernia is present.  Musculoskeletal:        General: No tenderness.     Cervical back: Neck supple.     Right lower leg: No edema.     Left lower leg: No edema.  Skin:    Findings: No rash.  Neurological:     Mental Status: She is alert and oriented to person, place, and time.     Motor: No weakness.  Psychiatric:        Mood and Affect: Mood and affect normal.        Behavior: Behavior normal.     BP 100/61   Pulse 62   Ht 5\' 4"  (1.626 m)   Wt 153 lb 4.8 oz (69.5 kg)   BMI 26.31 kg/m  Wt Readings from Last 3 Encounters:  08/28/21 153 lb 4.8 oz (69.5 kg)  07/04/21 156 lb 14.4 oz (71.2 kg)  06/04/21 153 lb 9.6 oz (69.7 kg)     Health Maintenance Due  Topic Date Due   COVID-19 Vaccine (1) Never done   OPHTHALMOLOGY EXAM  Never done   Hepatitis C Screening  Never done   PAP SMEAR-Modifier  Never done   MAMMOGRAM  Never done   Zoster Vaccines- Shingrix (1 of 2) Never done   HEMOGLOBIN A1C  07/26/2021   INFLUENZA VACCINE  08/21/2021     There are no preventive care reminders to display for this patient.  Lab Results  Component Value Date   TSH 1.470 01/26/2021   Lab Results  Component Value Date   WBC 6.4 02/16/2021   HGB 11.6 (L) 02/16/2021  HCT 35.4 (L) 02/16/2021   MCV 85.5 02/16/2021   PLT 209 02/16/2021   Lab Results  Component Value Date   NA 137 02/16/2021   K 4.8 02/16/2021   CO2 21 (L) 02/16/2021   GLUCOSE 244 (H) 02/16/2021   BUN 25 (H) 02/16/2021   CREATININE 0.91 02/16/2021   BILITOT 1.0 11/23/2020   ALKPHOS 55 11/23/2020   AST 15 11/23/2020   ALT 15 11/23/2020   PROT 7.3 11/23/2020   ALBUMIN 4.3 11/23/2020   CALCIUM 8.8 (L) 02/16/2021   ANIONGAP 8 02/16/2021   Lab Results  Component Value Date   CHOL 167 01/26/2021   Lab Results  Component Value Date   HDL 57 01/26/2021   Lab Results  Component Value Date   LDLCALC 99 01/26/2021   Lab Results  Component Value Date   TRIG 57 01/26/2021   Lab Results  Component Value Date   CHOLHDL 2.9 01/26/2021   Lab Results  Component Value Date   HGBA1C 9.7 (H) 01/26/2021      Assessment & Plan:   Problem List Items Addressed This Visit       Cardiovascular and Mediastinum   Essential hypertension (Chronic)    Blood pressure is under control      CAD (coronary artery disease)    No chest pain heart is regular chest is clear        Endocrine   Diabetes mellitus (HCC)    Blood sugar is under control      Relevant Orders   POCT glucose (manual entry) (Completed)     Other   Tobacco dependence (Chronic)    Patient was advised to quit smoking      Suspected COVID-19 virus infection    Patient is negative for COVID      Other Visit Diagnoses     Chills    -  Primary   Relevant Orders   POC COVID-19 (Completed)       No orders of the defined types were placed in this encounter.   Follow-up: No follow-ups on file.    Corky Downs, MD

## 2021-09-09 NOTE — Assessment & Plan Note (Signed)
Blood pressure is under control 

## 2021-09-09 NOTE — Assessment & Plan Note (Signed)
Patient is negative for COVID ?

## 2021-09-09 NOTE — Assessment & Plan Note (Signed)
Patient was advised to quit smoking 

## 2021-09-29 ENCOUNTER — Other Ambulatory Visit: Payer: Self-pay | Admitting: *Deleted

## 2021-09-29 DIAGNOSIS — Z1231 Encounter for screening mammogram for malignant neoplasm of breast: Secondary | ICD-10-CM

## 2021-10-01 ENCOUNTER — Other Ambulatory Visit: Payer: Self-pay | Admitting: *Deleted

## 2021-10-01 DIAGNOSIS — E119 Type 2 diabetes mellitus without complications: Secondary | ICD-10-CM

## 2021-10-01 MED ORDER — EMPAGLIFLOZIN 25 MG PO TABS: 25.0000 mg | ORAL_TABLET | Freq: Every day | ORAL | 3 refills | Status: DC

## 2021-11-12 ENCOUNTER — Other Ambulatory Visit: Payer: Self-pay | Admitting: *Deleted

## 2021-11-12 DIAGNOSIS — E1169 Type 2 diabetes mellitus with other specified complication: Secondary | ICD-10-CM

## 2021-11-12 MED ORDER — GLIMEPIRIDE 2 MG PO TABS: 2.0000 mg | ORAL_TABLET | Freq: Every day | ORAL | 3 refills | Status: DC

## 2022-05-09 IMAGING — CR DG NECK SOFT TISSUE
1 series · 2 of 2 positions shown · non-contrast
Comparison: None.

CLINICAL DATA: Neck pain

EXAM:
NECK SOFT TISSUES - 1+ VIEW

[Series 1: dg neck soft tissue · 0.14mm/px · 2 of 2 slices shown]
[im 1/2]
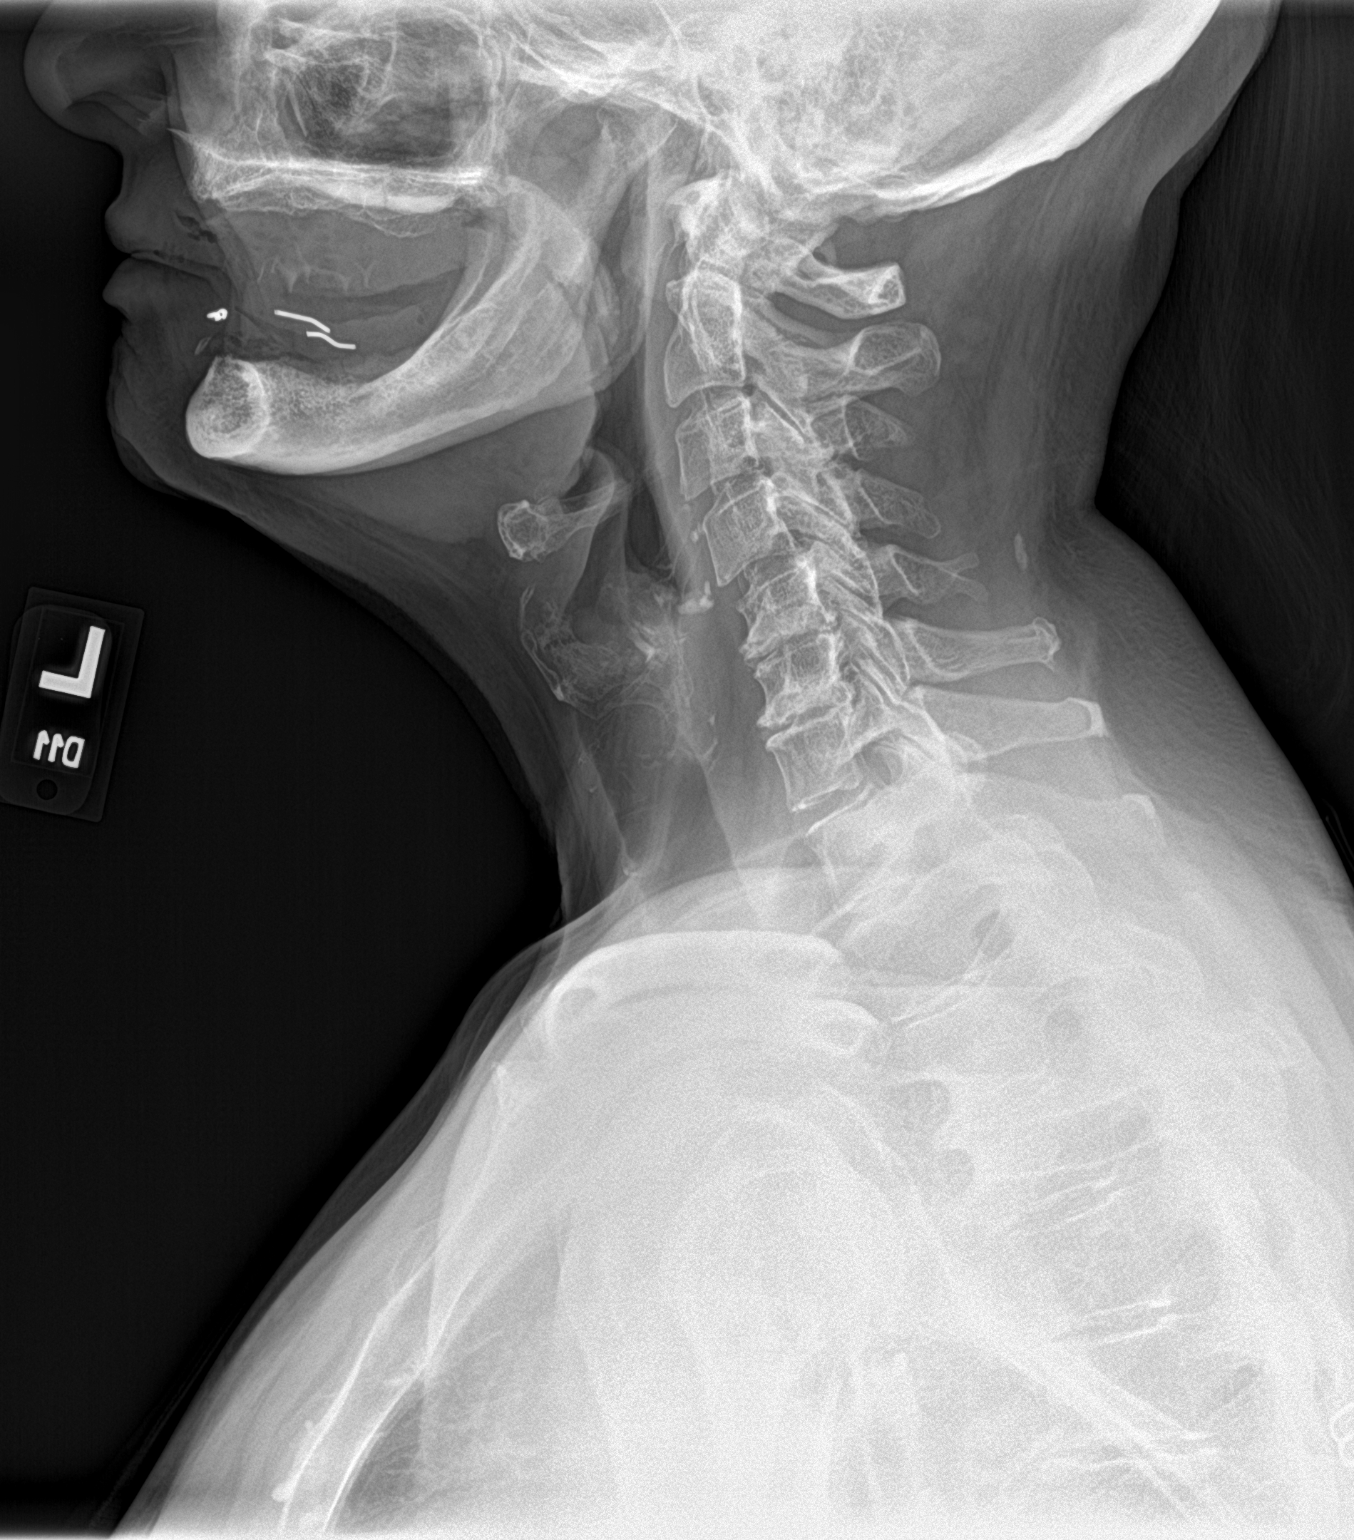
[im 2/2]
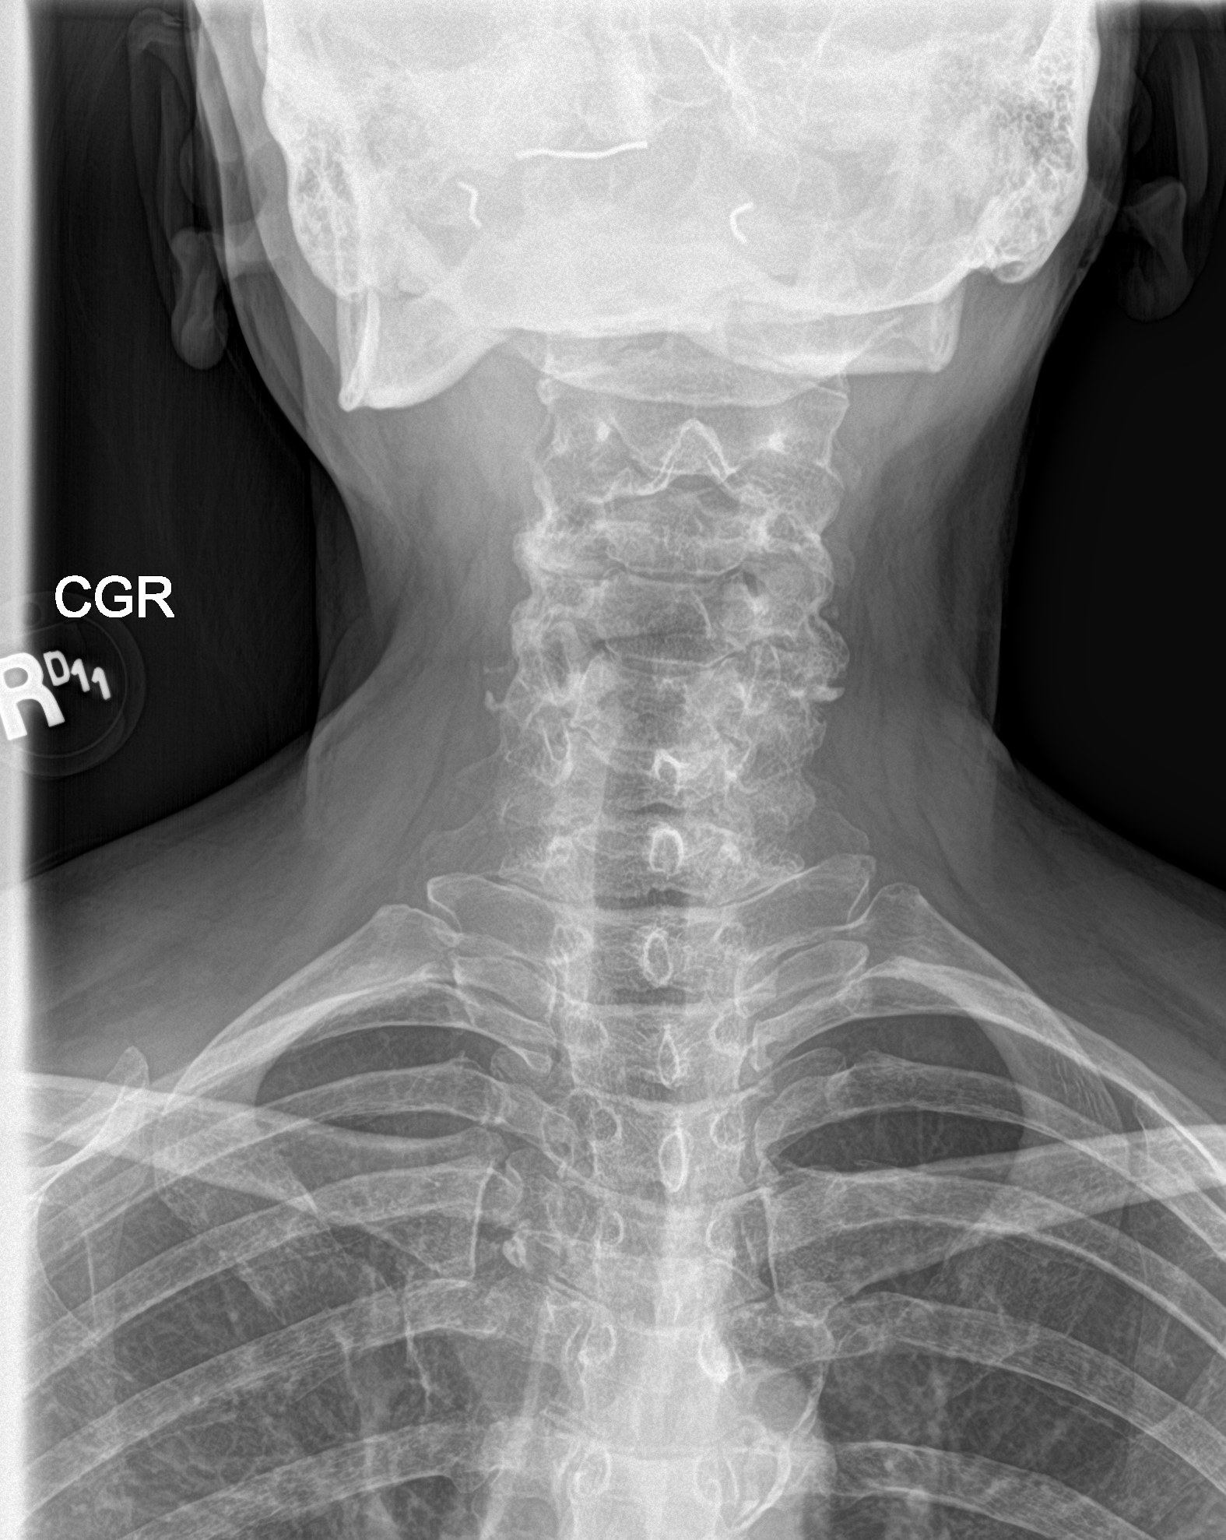

[2 of 2 positions shown; findings below may reference images not displayed]

FINDINGS: There is no evidence of retropharyngeal soft tissue swelling or
epiglottic enlargement. The cervical airway is unremarkable and no
radio-opaque foreign body identified. There is grade 1
anterolisthesis at C4-5. Degenerative disc disease is greatest at
C5-6 and C6-7. Multilevel facet hypertrophy. Prevertebral
calcifications are likely calcifications of the arytenoid cartilage.
IMPRESSION: 1. No acute abnormality of the neck.
2. Grade 1 anterolisthesis of C4-5.
3. Multilevel degenerative disc disease and facet hypertrophy.

## 2022-09-09 ENCOUNTER — Ambulatory Visit: Payer: Self-pay | Admitting: Family Medicine

## 2022-09-26 ENCOUNTER — Other Ambulatory Visit: Payer: Self-pay | Admitting: Student

## 2022-09-26 DIAGNOSIS — H9122 Sudden idiopathic hearing loss, left ear: Secondary | ICD-10-CM

## 2022-10-07 ENCOUNTER — Telehealth: Payer: Self-pay | Admitting: Internal Medicine

## 2022-10-09 ENCOUNTER — Encounter: Payer: Self-pay | Admitting: Student

## 2022-10-10 ENCOUNTER — Ambulatory Visit
Admission: RE | Admit: 2022-10-10 | Discharge: 2022-10-10 | Disposition: A | Discharge status: Home / Self Care | Payer: 59 | Source: Ambulatory Visit | Attending: Student | Admitting: Student

## 2022-10-10 DIAGNOSIS — H9122 Sudden idiopathic hearing loss, left ear: Secondary | ICD-10-CM

## 2022-10-10 MED ORDER — GADOPICLENOL 0.5 MMOL/ML IV SOLN
7.5000 mL | Freq: Once | INTRAVENOUS | Status: AC | PRN
  Administered 2022-10-10: 7.5 mL via INTRAVENOUS

## 2022-11-12 ENCOUNTER — Encounter: Payer: Self-pay | Admitting: Family Medicine

## 2022-11-12 ENCOUNTER — Ambulatory Visit (INDEPENDENT_AMBULATORY_CARE_PROVIDER_SITE_OTHER): Payer: 59 | Admitting: Family Medicine

## 2022-11-12 VITALS — BP 124/70 | HR 69 | Temp 98.0°F | Ht 64.0 in | Wt 165.9 lb

## 2022-11-12 DIAGNOSIS — J310 Chronic rhinitis: Secondary | ICD-10-CM

## 2022-11-12 DIAGNOSIS — I152 Hypertension secondary to endocrine disorders: Secondary | ICD-10-CM

## 2022-11-12 DIAGNOSIS — E1159 Type 2 diabetes mellitus with other circulatory complications: Secondary | ICD-10-CM

## 2022-11-12 DIAGNOSIS — F17200 Nicotine dependence, unspecified, uncomplicated: Secondary | ICD-10-CM

## 2022-11-12 DIAGNOSIS — Z780 Asymptomatic menopausal state: Secondary | ICD-10-CM

## 2022-11-12 DIAGNOSIS — B351 Tinea unguium: Secondary | ICD-10-CM | POA: Diagnosis not present

## 2022-11-12 DIAGNOSIS — Z1231 Encounter for screening mammogram for malignant neoplasm of breast: Secondary | ICD-10-CM | POA: Insufficient documentation

## 2022-11-12 DIAGNOSIS — Z0000 Encounter for general adult medical examination without abnormal findings: Secondary | ICD-10-CM | POA: Insufficient documentation

## 2022-11-12 DIAGNOSIS — I252 Old myocardial infarction: Secondary | ICD-10-CM

## 2022-11-12 DIAGNOSIS — Z1211 Encounter for screening for malignant neoplasm of colon: Secondary | ICD-10-CM

## 2022-11-12 DIAGNOSIS — Z0001 Encounter for general adult medical examination with abnormal findings: Secondary | ICD-10-CM | POA: Diagnosis not present

## 2022-11-12 DIAGNOSIS — E1169 Type 2 diabetes mellitus with other specified complication: Secondary | ICD-10-CM

## 2022-11-12 DIAGNOSIS — Z7689 Persons encountering health services in other specified circumstances: Secondary | ICD-10-CM | POA: Insufficient documentation

## 2022-11-12 DIAGNOSIS — L602 Onychogryphosis: Secondary | ICD-10-CM

## 2022-11-12 DIAGNOSIS — G720 Drug-induced myopathy: Secondary | ICD-10-CM

## 2022-11-12 DIAGNOSIS — Z23 Encounter for immunization: Secondary | ICD-10-CM | POA: Diagnosis not present

## 2022-11-12 DIAGNOSIS — Z114 Encounter for screening for human immunodeficiency virus [HIV]: Secondary | ICD-10-CM

## 2022-11-12 DIAGNOSIS — Z1159 Encounter for screening for other viral diseases: Secondary | ICD-10-CM

## 2022-11-12 MED ORDER — AZELASTINE-FLUTICASONE 137-50 MCG/ACT NA SUSP: 1.0000 | Freq: Two times a day (BID) | NASAL | 11 refills | Status: AC

## 2022-11-12 MED ORDER — GLIMEPIRIDE 2 MG PO TABS: 2.0000 mg | ORAL_TABLET | Freq: Every day | ORAL | 3 refills | Status: DC

## 2022-11-12 MED ORDER — EMPAGLIFLOZIN 25 MG PO TABS: 25.0000 mg | ORAL_TABLET | Freq: Every day | ORAL | 3 refills | Status: DC

## 2022-11-12 MED ORDER — NITROGLYCERIN 0.4 MG SL SUBL: 0.4000 mg | SUBLINGUAL_TABLET | SUBLINGUAL | 0 refills | Status: AC | PRN

## 2022-11-12 NOTE — Patient Instructions (Addendum)
Your symptoms and exam findings are most consistent with a viral upper respiratory infection. These usually run their course in 5-7 days. Unfortunately, antibiotics don't work against viruses and just increase your risk of other issues such as diarrhea, yeast infections, and resistant infections.  If you start feeling worse with facial pain, high fever, cough, shortness of breath or start feeling significantly worse, please call us right away to be further evaluated.  Some things that can make you feel better are: - Increased rest - Increasing Fluids - Acetaminophen / ibuprofen as needed for fever/pain.  - Salt water gargling, chloraseptic spray and throat lozenges - OTC pseudoephedrine.  - Mucinex.  - Saline sinus flushes or a neti pot.  - Humidifying the air.   Please call and schedule your mammogram:  [facility]  [address], [address] [facility] [place],  [place]  [address]  Main: [phone]

## 2022-11-12 NOTE — Progress Notes (Signed)
New patient visit   Patient: Elizabeth Hardin   DOB: [birth year]   62 y.o. Female  MRN: [id number] Visit Date: 11/12/2022  Today's healthcare provider: Jacky Kindle, FNP  Patient presents for new patient visit to establish care.  Introduced to Publishing rights manager role and practice setting.  All questions answered.  Discussed provider/patient relationship and expectations.  Presents with husband, Henrine Screws Complaint  Patient presents with   Establish Care   Cough    For 4 weeks   Nasal Congestion    For 4 weeks   Subjective    Elizabeth Hardin is a 62 y.o. female who presents today as a new patient to establish care.  Cough   HPI     Cough    Additional comments: For 4 weeks        Nasal Congestion    Additional comments: For 4 weeks      Last edited by Barrie Lyme on 11/12/2022  3:47 PM.        Past Medical History:  Diagnosis Date   Allergy    Coronary artery disease    Diabetes mellitus without complication (HCC)    Hyperlipidemia    Hypertension    Neck pain    Past Surgical History:  Procedure Laterality Date   CORONARY STENT INTERVENTION N/A 02/15/2021   Procedure: CORONARY STENT INTERVENTION;  Surgeon: Marcina Millard, MD;  Location: [facility] IN[facility]IVE [facility];  Service: Cardiovascular;  Laterality: N/A;   CORONARY/GRAFT ACUTE MI REVASCULARIZATION N/A 01/26/2021   Procedure: Coronary/Graft Acute MI Revascularization;  Surgeon: Marcina Millard, MD;  Location: [facility];  Service: Cardiovascular;  Laterality: N/A;   LEFT HEART CATH AND CORONARY ANGIOGRAPHY N/A 01/26/2021   Procedure: LEFT HEART CATH AND CORONARY ANGIOGRAPHY;  Surgeon: Marcina Millard, MD;  Location: [facility] CV [facility];  Service: Cardiovascular;  Laterality: N/A;   TUBAL LIGATION     Family Status  Relation Name Status   Mother  (Not Specified)   Father  (Not Specified)   Brother  (Not Specified)  No partnership data on file   Family  History  Problem Relation Age of Onset   CAD Mother    CAD Father    CAD Brother    Social History   Socioeconomic History   Marital status: Married    Spouse name: Not on file   Number of children: Not on file   Years of education: Not on file   Highest education level: Not on file  Occupational History   Occupation: packing boxes  Tobacco Use   Smoking status: Every Day    Current packs/day: 0.00    Average packs/day: 1 pack/day for 45.0 years (45.0 ttl pk-yrs)    Types: Cigarettes    Start date: 01/27/1976    Last attempt to quit: 01/26/2021    Years since quitting: 1.7   Smokeless tobacco: Never  Substance and Sexual Activity   Alcohol use: Not Currently   Drug use: Never   Sexual activity: Yes  Other Topics Concern   Not on file  Social History Narrative   Not on file   Social Determinants of Health   Financial Resource Strain: Not on file  Food Insecurity: Not on file  Transportation Needs: Not on file  Physical Activity: Not on file  Stress: Not on file  Social Connections: Not on file   Outpatient Medications Prior to Visit  Medication Sig   aspirin 81 MG chewable tablet  Chew 1 tablet (81 mg total) by mouth daily.   cyclobenzaprine (FLEXERIL) 5 MG tablet Take 1 tablet (5 mg total) by mouth 3 (three) times daily as needed for muscle spasms.   empagliflozin (JARDIANCE) 25 MG TABS tablet Take 1 tablet (25 mg total) by mouth daily before breakfast.   glimepiride (AMARYL) 2 MG tablet Take 1 tablet (2 mg total) by mouth daily before breakfast.   lisinopril (ZESTRIL) 5 MG tablet Take 1 tablet (5 mg total) by mouth daily.   metoprolol tartrate (LOPRESSOR) 25 MG tablet Take 1 tablet (25 mg total) by mouth 2 (two) times daily.   nitroGLYCERIN (NITROSTAT) 0.4 MG SL tablet Place 1 tablet (0.4 mg total) under the tongue every 5 (five) minutes as needed for chest pain.   ticagrelor (BRILINTA) 90 MG TABS tablet Take 1 tablet (90 mg total) by mouth 2 (two) times daily.    atorvastatin (LIPITOR) 80 MG tablet Take 1 tablet (80 mg total) by mouth daily. (Patient not taking: Reported on 11/12/2022)   methocarbamol (ROBAXIN) 500 MG tablet Take 1 tablet (500 mg total) by mouth 3 (three) times daily. (Patient not taking: Reported on 11/12/2022)   No facility-administered medications prior to visit.   Allergies  Allergen Reactions   Metformin And Related Diarrhea    Immunization History  Administered Date(s) Administered   Influenza,inj,Quad PF,6+ Mos 01/28/2021    Health Maintenance  Topic Date Due   FOOT EXAM  Never done   OPHTHALMOLOGY EXAM  Never done   Diabetic kidney evaluation - Urine ACR  Never done   Hepatitis C Screening  Never done   DTaP/Tdap/Td (1 - Tdap) Never done   Cervical Cancer Screening (HPV/Pap Cotest)  Never done   Colonoscopy  Never done   Lung Cancer Screening  Never done   MAMMOGRAM  Never done   Zoster Vaccines- Shingrix (1 of 2) Never done   HEMOGLOBIN A1C  07/26/2021   Diabetic kidney evaluation - eGFR measurement  02/16/2022   INFLUENZA VACCINE  08/22/2022   COVID-19 Vaccine (1 - 2023-24 season) Never done   HIV Screening  Completed   HPV VACCINES  Aged Out    Patient Care Team: Jacky Kindle, FNP as PCP - General (Family Medicine)  Review of Systems  Respiratory:  Positive for cough.     Objective    BP 124/70 (BP Location: Right Arm, Patient Position: Sitting, Cuff Size: Normal)   Pulse 69   Temp 98 F (36.7 C) (Oral)   Ht 5\' 4"  (1.626 m)   Wt 165 lb 14.4 oz (75.3 kg)   SpO2 100%   BMI 28.48 kg/m   Physical Exam Vitals and nursing note reviewed.  Constitutional:      General: She is awake. She is not in acute distress.    Appearance: Normal appearance. She is well-developed, well-groomed and overweight. She is not ill-appearing, toxic-appearing or diaphoretic.  HENT:     Head: Normocephalic and atraumatic.     Jaw: There is normal jaw occlusion. No trismus, tenderness, swelling or pain on movement.      Right Ear: Hearing, tympanic membrane, ear canal and external ear normal. There is no impacted cerumen.     Left Ear: Hearing, tympanic membrane, ear canal and external ear normal. There is no impacted cerumen.     Nose: Rhinorrhea present. No congestion.     Right Turbinates: Not enlarged, swollen or pale.     Left Turbinates: Not enlarged, swollen or pale.  Right Sinus: No maxillary sinus tenderness or frontal sinus tenderness.     Left Sinus: No maxillary sinus tenderness or frontal sinus tenderness.     Mouth/Throat:     Lips: Pink.     Mouth: Mucous membranes are moist. No injury.     Tongue: No lesions.     Pharynx: Oropharynx is clear. Uvula midline. No pharyngeal swelling, oropharyngeal exudate, posterior oropharyngeal erythema or uvula swelling.     Tonsils: No tonsillar exudate or tonsillar abscesses.  Eyes:     General: Lids are normal. Lids are everted, no foreign bodies appreciated. Vision grossly intact. Gaze aligned appropriately. No allergic shiner or visual field deficit.       Right eye: No discharge.        Left eye: No discharge.     Conjunctiva/sclera: Conjunctivae normal.     Right eye: Right conjunctiva is not injected. No exudate.    Left eye: Left conjunctiva is not injected. No exudate. Neck:     Thyroid: No thyroid mass, thyromegaly or thyroid tenderness.     Vascular: No carotid bruit.     Trachea: Trachea normal.  Cardiovascular:     Rate and Rhythm: Normal rate and regular rhythm.     Pulses: Normal pulses.          Carotid pulses are 2+ on the right side and 2+ on the left side.      Radial pulses are 2+ on the right side and 2+ on the left side.       Dorsalis pedis pulses are 2+ on the right side and 2+ on the left side.       Posterior tibial pulses are 2+ on the right side and 2+ on the left side.     Heart sounds: Normal heart sounds, S1 normal and S2 normal. No murmur heard.    No friction rub. No gallop.  Pulmonary:     Effort: Pulmonary  effort is normal. No respiratory distress.     Breath sounds: Normal breath sounds and air entry. No stridor. No wheezing, rhonchi or rales.     Comments: Non productive cough Chest:     Chest wall: No tenderness.  Abdominal:     General: Abdomen is flat. Bowel sounds are normal. There is no distension.     Palpations: Abdomen is soft. There is no mass.     Tenderness: There is no abdominal tenderness. There is no right CVA tenderness, left CVA tenderness, guarding or rebound.     Hernia: No hernia is present.  Genitourinary:    Comments: Exam deferred; denies complaints Musculoskeletal:        General: No swelling, tenderness, deformity or signs of injury. Normal range of motion.     Cervical back: Full passive range of motion without pain, normal range of motion and neck supple. No edema, rigidity or tenderness. No muscular tenderness.     Right lower leg: No edema.     Left lower leg: No edema.  Lymphadenopathy:     Cervical: No cervical adenopathy.     Right cervical: No superficial, deep or posterior cervical adenopathy.    Left cervical: No superficial, deep or posterior cervical adenopathy.  Skin:    General: Skin is warm and dry.     Capillary Refill: Capillary refill takes less than 2 seconds.     Coloration: Skin is not jaundiced or pale.     Findings: No bruising, erythema, lesion or rash.  Neurological:  General: No focal deficit present.     Mental Status: She is alert and oriented to person, place, and time. Mental status is at baseline.     GCS: GCS eye subscore is 4. GCS verbal subscore is 5. GCS motor subscore is 6.     Sensory: Sensation is intact. No sensory deficit.     Motor: Motor function is intact. No weakness.     Coordination: Coordination is intact. Coordination normal.     Gait: Gait is intact. Gait normal.  Psychiatric:        Attention and Perception: Attention and perception normal.        Mood and Affect: Mood and affect normal.        Speech:  Speech normal.        Behavior: Behavior normal. Behavior is cooperative.        Thought Content: Thought content normal.        Cognition and Memory: Cognition and memory normal.        Judgment: Judgment normal.    Depression Screen    01/31/2021   11:48 AM  PHQ 2/9 Scores  PHQ - 2 Score 0   No results found for any visits on 11/12/22.  Assessment & Plan      Problem List Items Addressed This Visit       Cardiovascular and Mediastinum   Hypertension associated with diabetes (HCC)   Relevant Medications   lisinopril (ZESTRIL) 5 MG tablet   glimepiride (AMARYL) 2 MG tablet   empagliflozin (JARDIANCE) 25 MG TABS tablet   nitroGLYCERIN (NITROSTAT) 0.4 MG SL tablet     Respiratory   Chronic rhinitis   Relevant Medications   fluticasone (FLONASE) 50 MCG/ACT nasal spray   Azelastine-Fluticasone 137-50 MCG/ACT SUSP     Endocrine   Diabetes mellitus (HCC)   Relevant Medications   lisinopril (ZESTRIL) 5 MG tablet   glimepiride (AMARYL) 2 MG tablet   empagliflozin (JARDIANCE) 25 MG TABS tablet   Other Relevant Orders   CBC with Differential/Platelet   Comprehensive Metabolic Panel (CMET)   TSH   Hemoglobin A1c   Lipid panel   Urine Microalbumin w/creat. ratio   Ambulatory Referral Lung Cancer Screening [facility]   Ambulatory referral to Podiatry   Ambulatory referral to Ophthalmology   Hyperlipidemia associated with type 2 diabetes mellitus (HCC)   Relevant Medications   lisinopril (ZESTRIL) 5 MG tablet   glimepiride (AMARYL) 2 MG tablet   empagliflozin (JARDIANCE) 25 MG TABS tablet   nitroGLYCERIN (NITROSTAT) 0.4 MG SL tablet     Musculoskeletal and Integument   Nail fungus   Statin myopathy   Relevant Orders   AMB Referral to [facility]     Other   Tobacco dependence (Chronic)   Relevant Medications   nitroGLYCERIN (NITROSTAT) 0.4 MG SL tablet   Other Relevant Orders   Ambulatory Referral Lung Cancer Screening [facility]    AMB Referral to [facility]   Annual physical exam - Primary   Encounter for hepatitis C screening test for low risk patient   Relevant Orders   Hepatitis C Antibody   Encounter for screening for HIV   Relevant Orders   HIV antibody (with reflex)   Encounter for screening mammogram for malignant neoplasm of breast   Establishing care with new doctor, encounter for   History of ST elevation myocardial infarction (STEMI)   Relevant Medications   nitroGLYCERIN (NITROSTAT) 0.4 MG SL tablet   Other  Relevant Orders   AMB Referral to [facility]   Post-menopausal   Relevant Orders   Ambulatory referral to Gynecology   Screen for colon cancer   Relevant Orders   Ambulatory referral to Gastroenterology   Screening mammogram for breast cancer   Relevant Orders   MM 3D SCREENING MAMMOGRAM BILATERAL BREAST   Thick nail   Other Visit Diagnoses     Encounter for immunization       Relevant Orders   Flu vaccine trivalent PF, 6mos and older(Flulaval,Afluria,Fluarix,Fluzone) (Completed)     Chronic Cough and Congestion Likely allergic in nature, with a history of similar symptoms during season changes. No associated fever or chills. Currently on over-the-counter Flonase and Zyrtec. -Continue Flonase and Zyrtec. -Add prescription allergy nasal spray twice daily to help with congestion and post-nasal drip. -Continue supportive care measures; return to clinic if symptoms worsen.  Diabetes Mellitus; Chronic, unknown A1c On Jardiance 25mg  and Glimepiride 2 mg for blood sugar control. No recent labs available. -Order labs including CBC, basic electrolytes, thyroid function, HbA1c, cholesterol, and urine protein. -Plan for 94-month follow-up to monitor blood sugar control.  Hypertension On Lisinopril for blood pressure control and kidney function protection. -Continue Lisinopril.  Tobacco Use Reports smoking approximately 1 pack per week. Attempting  to quit but struggling with nicotine patches. -Encourage continued efforts to quit smoking. -Recommend lung cancer screening  Hyperlipidemia Previously on Atorvastatin but discontinued due to bone aches. No current cholesterol medication. -Discuss potential alternative cholesterol medications at next visit. -Encouraged to discuss with advanced lipid clinic vs cardiologist.  General Health Maintenance -Recommend routine screenings including mammogram, colonoscopy and PAP. Orders placed are good for 1 year.   -Received flu vaccine today. -Eligible for shingles vaccine, available at pharmacy. -Order routine screening for HIV and Hepatitis C. -Encourage diabetic eye exam. -Refill medications as needed, send to [facility].    Leilani Merl, FNP, have reviewed all documentation for this visit. The documentation on 11/12/22 for the exam, diagnosis, procedures, and orders are all accurate and complete.  Jacky Kindle, FNP  [facility] [phone] (phone) [phone] (fax)  [facility]

## 2022-11-13 LAB — CBC WITH DIFFERENTIAL/PLATELET
Basophils Absolute: 0.1 10*3/uL (ref 0.0–0.2)
Basos: 1 %
EOS (ABSOLUTE): 0.3 10*3/uL (ref 0.0–0.4)
Eos: 4 %
Hematocrit: 40.9 % (ref 34.0–46.6)
Hemoglobin: 13.1 g/dL (ref 11.1–15.9)
Immature Grans (Abs): 0 10*3/uL (ref 0.0–0.1)
Immature Granulocytes: 1 %
Lymphocytes Absolute: 2.9 10*3/uL (ref 0.7–3.1)
Lymphs: 36 %
MCH: 28.1 pg (ref 26.6–33.0)
MCHC: 32 g/dL (ref 31.5–35.7)
MCV: 88 fL (ref 79–97)
Monocytes Absolute: 0.6 10*3/uL (ref 0.1–0.9)
Monocytes: 8 %
Neutrophils Absolute: 4.2 10*3/uL (ref 1.4–7.0)
Neutrophils: 50 %
Platelets: 269 10*3/uL (ref 150–450)
RBC: 4.66 x10E6/uL (ref 3.77–5.28)
RDW: 13.5 % (ref 11.7–15.4)
WBC: 8.2 10*3/uL (ref 3.4–10.8)

## 2022-11-13 LAB — HEMOGLOBIN A1C
Est. average glucose Bld gHb Est-mCnc: 197 mg/dL
Hgb A1c MFr Bld: 8.5 % — ABNORMAL HIGH (ref 4.8–5.6)

## 2022-11-13 LAB — LIPID PANEL
Chol/HDL Ratio: 4.1 ratio (ref 0.0–4.4)
Cholesterol, Total: 200 mg/dL — ABNORMAL HIGH (ref 100–199)
HDL: 49 mg/dL (ref >39)
LDL Chol Calc (NIH): 120 mg/dL — ABNORMAL HIGH (ref 0–99)
Triglycerides: 179 mg/dL — ABNORMAL HIGH (ref 0–149)
VLDL Cholesterol Cal: 31 mg/dL (ref 5–40)

## 2022-11-13 LAB — COMPREHENSIVE METABOLIC PANEL
ALT: 15 [IU]/L (ref 0–32)
AST: 15 [IU]/L (ref 0–40)
Albumin: 4.4 g/dL (ref 3.9–4.9)
Alkaline Phosphatase: 56 [IU]/L (ref 44–121)
BUN/Creatinine Ratio: 29 — ABNORMAL HIGH (ref 12–28)
BUN: 29 mg/dL — ABNORMAL HIGH (ref 8–27)
Bilirubin Total: 0.2 mg/dL (ref 0.0–1.2)
CO2: 18 mmol/L — ABNORMAL LOW (ref 20–29)
Calcium: 10 mg/dL (ref 8.7–10.3)
Chloride: 107 mmol/L — ABNORMAL HIGH (ref 96–106)
Creatinine, Ser: 1.01 mg/dL — ABNORMAL HIGH (ref 0.57–1.00)
Globulin, Total: 2.5 g/dL (ref 1.5–4.5)
Glucose: 127 mg/dL — ABNORMAL HIGH (ref 70–99)
Potassium: 5.1 mmol/L (ref 3.5–5.2)
Sodium: 141 mmol/L (ref 134–144)
Total Protein: 6.9 g/dL (ref 6.0–8.5)
eGFR: 63 mL/min/{1.73_m2} (ref >59)

## 2022-11-13 LAB — MICROALBUMIN / CREATININE URINE RATIO
Creatinine, Urine: 68 mg/dL
Microalb/Creat Ratio: 20 mg/g{creat} (ref 0–29)
Microalbumin, Urine: 13.9 ug/mL

## 2022-11-13 LAB — HIV ANTIBODY (ROUTINE TESTING W REFLEX): HIV Screen 4th Generation wRfx: NONREACTIVE

## 2022-11-13 LAB — HEPATITIS C ANTIBODY: Hep C Virus Ab: NONREACTIVE

## 2022-11-13 LAB — TSH: TSH: 0.869 u[IU]/mL (ref 0.450–4.500)

## 2022-11-15 ENCOUNTER — Ambulatory Visit: Payer: Self-pay | Admitting: *Deleted

## 2022-11-15 NOTE — Telephone Encounter (Signed)
Reason for Disposition  [1] Follow-up call to recent contact AND [2] information only call, no triage required  Answer Assessment - Initial Assessment Questions 1. REASON FOR CALL or QUESTION: "What is your reason for calling today?" or "How can I best help you?" or "What question do you have that I can help answer?"     Husband calling in.    She told me yesterday to have them call me instead of calling her.   No one called me back.  She had a question about one of the labs regarding a lipid clinic.  It's regarding the lab result message from Merita Norton, FNP that was read to his wife yesterday. She has chosen to do the [facility] instead of the nightly insulin injection.  I let the husband know that information was in the note from yesterday.    She is wanting to know what the Advanced Lipid Clinic is.   What does it entail?   Is it a class?    Please call husband (on [place]) with the answers to her questions because she's not allowed to have her phone at work.     See lab result note from 10/24 that has his phone number on it.   Thanks.  Protocols used: Information Only Call - No Triage-A-AH

## 2022-11-15 NOTE — Telephone Encounter (Signed)
  Chief Complaint: Pt has question about the Advanced Lipid Clinic.   Has decided to take the [facility] instead of the nightly insulin shot.    This is in response to the lab result note from Merita Norton FNP read to her yesterday. Symptoms: N/A Frequency: N/A Pertinent Negatives: Patient denies N/A Disposition: [] ED /[] Urgent Care (no appt availability in office) / [] Appointment(In office/virtual)/ []  [facility]/ [] Home Care/ [] Refused Recommended Disposition /[] [place] [facility]/ [x]  Follow-up with PCP Additional Notes: Please call husband with the answers to her question.   See note from 10/24 with his phone number on it.    She's not allowed to have her phone with her at work.   He is on her DPR.

## 2022-11-18 ENCOUNTER — Telehealth: Payer: Self-pay | Admitting: Family Medicine

## 2022-11-18 ENCOUNTER — Other Ambulatory Visit: Payer: Self-pay | Admitting: Family Medicine

## 2022-11-18 DIAGNOSIS — E1169 Type 2 diabetes mellitus with other specified complication: Secondary | ICD-10-CM

## 2022-11-18 DIAGNOSIS — I252 Old myocardial infarction: Secondary | ICD-10-CM

## 2022-11-18 DIAGNOSIS — E1159 Type 2 diabetes mellitus with other circulatory complications: Secondary | ICD-10-CM

## 2022-11-18 DIAGNOSIS — G720 Drug-induced myopathy: Secondary | ICD-10-CM

## 2022-11-18 MED ORDER — EZETIMIBE 10 MG PO TABS: 10.0000 mg | ORAL_TABLET | Freq: Every day | ORAL | 3 refills | Status: DC

## 2022-11-18 MED ORDER — REPATHA SURECLICK 140 MG/ML ~~LOC~~ SOAJ: 140.0000 mg | SUBCUTANEOUS | 11 refills | Status: DC

## 2022-11-18 MED ORDER — TIRZEPATIDE 2.5 MG/0.5ML ~~LOC~~ SOAJ: 2.5000 mg | SUBCUTANEOUS | 0 refills | Status: DC

## 2022-11-18 NOTE — Telephone Encounter (Signed)
Received a fax from covermymeds for mounjaro 2.5mg /0.33ml  Key:  [id number]

## 2022-11-19 ENCOUNTER — Encounter: Payer: Self-pay | Admitting: *Deleted

## 2022-11-26 NOTE — Telephone Encounter (Signed)
Your PA has been faxed to the plan as a paper copy. Please contact the plan directly if you haven't received a determination in a typical timeframe.  You will be notified of the determination via fax. Contact plan to follow up on [id number]

## 2022-11-27 NOTE — Telephone Encounter (Signed)
Form was faxed over to the secure fax number [employer] PA form [phone]

## 2022-11-28 ENCOUNTER — Telehealth: Payer: Self-pay

## 2022-11-28 NOTE — Telephone Encounter (Signed)
Elizabeth Hardin was approved.

## 2022-11-28 NOTE — Telephone Encounter (Signed)
[facility] pharmacy and advised the pharmacist. Also left a message on patient's voicemail advising.

## 2022-11-28 NOTE — Telephone Encounter (Signed)
Copied from CRM [id number]. Topic: General - Other >> Nov 28, 2022 10:30 AM Franchot Heidelberg wrote: Reason for CRM:   [employer] group benefits service called requesting:  Needs lab work associated with visit on 11/12/2022 so she can complete the prior auth for Repatha. She needs the total cholesterol labs sent as well   Best fax: [phone]  Best contact: [phone]

## 2022-11-28 NOTE — Telephone Encounter (Signed)
Faxed

## 2022-11-29 NOTE — Telephone Encounter (Signed)
Elizabeth Hardin with group benefits calling about approval

## 2022-12-02 NOTE — Telephone Encounter (Signed)
Called back and office was closed.

## 2022-12-08 NOTE — Progress Notes (Deleted)
Jacky Kindle, FNP   No chief complaint on file.   HPI:      Ms. Elizabeth Hardin is a 61 y.o. No obstetric history on file. whose LMP was No LMP recorded. Patient is postmenopausal., presents today for NP pap smear, referred by PCP.     Patient Active Problem List   Diagnosis Date Noted   Annual physical exam 11/12/2022   Statin myopathy 11/12/2022   Hypertension associated with diabetes (HCC) 11/12/2022   Hyperlipidemia associated with type 2 diabetes mellitus (HCC) 11/12/2022   Establishing care with new doctor, encounter for 11/12/2022   Encounter for hepatitis C screening test for low risk patient 11/12/2022   Encounter for screening for HIV 11/12/2022   Screening mammogram for breast cancer 11/12/2022   Screen for colon cancer 11/12/2022   Encounter for screening mammogram for malignant neoplasm of breast 11/12/2022   History of ST elevation myocardial infarction (STEMI) 11/12/2022   Post-menopausal 11/12/2022   Thick nail 11/12/2022   Nail fungus 11/12/2022   Chronic rhinitis 11/12/2022   Suspected COVID-19 virus infection 06/04/2021   Acute pharyngitis due to other specified organisms 06/04/2021   S/P coronary artery stent placement 02/23/2021   CAD (coronary artery disease) 02/15/2021   Diabetes mellitus type 2, diet-controlled (HCC) 02/09/2021   Pure hypercholesterolemia 02/09/2021   STEMI (ST elevation myocardial infarction) (HCC) 01/26/2021   Tobacco dependence 01/26/2021   Neck pain 03/07/2020   Diabetes mellitus (HCC) 03/07/2020   Primary osteoarthritis involving multiple joints 03/07/2020   Essential hypertension 03/07/2020    Past Surgical History:  Procedure Laterality Date   CORONARY STENT INTERVENTION N/A 02/15/2021   Procedure: CORONARY STENT INTERVENTION;  Surgeon: Marcina Millard, MD;  Location: [facility] INV[facility]IVE [facility];  Service: Cardiovascular;  Laterality: N/A;   CORONARY/GRAFT ACUTE MI REVASCULARIZATION N/A 01/26/2021   Procedure:  Coronary/Graft Acute MI Revascularization;  Surgeon: Marcina Millard, MD;  Location: [facility];  Service: Cardiovascular;  Laterality: N/A;   LEFT HEART CATH AND CORONARY ANGIOGRAPHY N/A 01/26/2021   Procedure: LEFT HEART CATH AND CORONARY ANGIOGRAPHY;  Surgeon: Marcina Millard, MD;  Location: [facility];  Service: Cardiovascular;  Laterality: N/A;   TUBAL LIGATION      Family History  Problem Relation Age of Onset   CAD Mother    CAD Father    CAD Brother     Social History   Socioeconomic History   Marital status: Married    Spouse name: Not on file   Number of children: Not on file   Years of education: Not on file   Highest education level: Not on file  Occupational History   Occupation: packing boxes  Tobacco Use   Smoking status: Every Day    Current packs/day: 0.00    Average packs/day: 1 pack/day for 45.0 years (45.0 ttl pk-yrs)    Types: Cigarettes    Start date: 01/27/1976    Last attempt to quit: 01/26/2021    Years since quitting: 1.8   Smokeless tobacco: Never   Tobacco comments:    1 ppy  Substance and Sexual Activity   Alcohol use: Not Currently   Drug use: Never   Sexual activity: Yes  Other Topics Concern   Not on file  Social History Narrative   Not on file   Social Determinants of Health   Financial Resource Strain: Not on file  Food Insecurity: Not on file  Transportation Needs: Not on file  Physical Activity: Not on file  Stress: Not on file  Social Connections: Not on file  Intimate Partner Violence: Not on file    Outpatient Medications Prior to Visit  Medication Sig Dispense Refill   aspirin 81 MG chewable tablet Chew 1 tablet (81 mg total) by mouth daily. 90 tablet 1   atorvastatin (LIPITOR) 80 MG tablet Take 1 tablet (80 mg total) by mouth daily. (Patient not taking: Reported on 11/12/2022) 90 tablet 1   Azelastine-Fluticasone 137-50 MCG/ACT SUSP Place 1 spray into the nose every 12 (twelve) hours. 23 g 11    cyclobenzaprine (FLEXERIL) 5 MG tablet Take 1 tablet (5 mg total) by mouth 3 (three) times daily as needed for muscle spasms. 90 tablet 1   empagliflozin (JARDIANCE) 25 MG TABS tablet Take 1 tablet (25 mg total) by mouth daily before breakfast. 90 tablet 3   Evolocumab (REPATHA SURECLICK) 140 MG/ML SOAJ Inject 140 mg into the skin every 14 (fourteen) days. 2 mL 11   ezetimibe (ZETIA) 10 MG tablet Take 1 tablet (10 mg total) by mouth daily. 90 tablet 3   fluticasone (FLONASE) 50 MCG/ACT nasal spray Place 2 sprays into both nostrils daily.     glimepiride (AMARYL) 2 MG tablet Take 1 tablet (2 mg total) by mouth daily before breakfast. 90 tablet 3   lisinopril (ZESTRIL) 5 MG tablet Take 1 tablet by mouth daily.     methocarbamol (ROBAXIN) 500 MG tablet Take 1 tablet (500 mg total) by mouth 3 (three) times daily. (Patient not taking: Reported on 11/12/2022) 90 tablet 4   metoprolol tartrate (LOPRESSOR) 25 MG tablet Take 1 tablet (25 mg total) by mouth 2 (two) times daily. 60 tablet 1   nitroGLYCERIN (NITROSTAT) 0.4 MG SL tablet Place 1 tablet (0.4 mg total) under the tongue every 5 (five) minutes as needed for chest pain (Call 9-1-1 if you take your third medication). 30 tablet 0   ticagrelor (BRILINTA) 90 MG TABS tablet Take 1 tablet (90 mg total) by mouth 2 (two) times daily. 60 tablet 1   tirzepatide (MOUNJARO) 2.5 MG/0.5ML Pen Inject 2.5 mg into the skin once a week. 2 mL 0   No facility-administered medications prior to visit.      ROS:  Review of Systems BREAST: No symptoms   OBJECTIVE:   Vitals:  There were no vitals taken for this visit.  Physical Exam  Results: No results found for this or any previous visit (from the past 24 hour(s)).   Assessment/Plan: No diagnosis found.    No orders of the defined types were placed in this encounter.     No follow-ups on file.  Karen Huhta B. Akeya Ryther, PA-C 12/08/2022 4:51 PM

## 2022-12-09 ENCOUNTER — Encounter: Payer: Self-pay | Admitting: Obstetrics and Gynecology

## 2022-12-09 DIAGNOSIS — Z1151 Encounter for screening for human papillomavirus (HPV): Secondary | ICD-10-CM

## 2022-12-09 DIAGNOSIS — Z124 Encounter for screening for malignant neoplasm of cervix: Secondary | ICD-10-CM

## 2022-12-10 ENCOUNTER — Encounter: Payer: Self-pay | Admitting: Obstetrics and Gynecology

## 2022-12-26 ENCOUNTER — Telehealth: Payer: Self-pay | Admitting: Family Medicine

## 2023-01-02 ENCOUNTER — Other Ambulatory Visit: Payer: Self-pay | Admitting: Family Medicine

## 2023-01-02 DIAGNOSIS — E1169 Type 2 diabetes mellitus with other specified complication: Secondary | ICD-10-CM

## 2023-01-02 NOTE — Telephone Encounter (Signed)
Requested medications are due for refill today.  yes  Requested medications are on the active medications list.  yes  Last refill. 11/18/2022 2mL  Future visit scheduled.   yes  Notes to clinic.  Medication not assigned to a protocol. Please review for refill.    Requested Prescriptions  Pending Prescriptions Disp Refills   tirzepatide (MOUNJARO) 2.5 MG/0.5ML Pen 2 mL 0    Sig: Inject 2.5 mg into the skin once a week.     Off-Protocol Failed - 01/02/2023 12:09 PM      Failed - Medication not assigned to a protocol, review manually.      Passed - Valid encounter within last 12 months    Recent Outpatient Visits           1 month ago Annual physical exam   [facility] Jacky Kindle, FNP       Future Appointments             In 1 month Ephriam Knuckles, Jennette Kettle, FNP [facility] [place], [place]

## 2023-01-02 NOTE — Telephone Encounter (Signed)
Medication Refill -  Most Recent Primary Care Visit:  Provider: Merita Norton T  Department: [facility]  Visit Type: NEW PATIENT  Date: 11/12/2022  Medication: tirzepatide [facility]) 2.5 MG/0.5ML Pen   Has the patient contacted their pharmacy? Yes  Is this the correct pharmacy for this prescription? Yes  This is the patient's preferred pharmacy:  [facility] [place], [place] - [address] [address] Phone: [phone]  Fax: [phone]      Has the prescription been filled recently? No  Is the patient out of the medication? Yes  Has the patient been seen for an appointment in the last year OR does the patient have an upcoming appointment? Yes  Can we respond through MyChart? No  Agent: Please be advised that Rx refills may take up to 3 business days. We ask that you follow-up with your pharmacy.

## 2023-01-03 ENCOUNTER — Other Ambulatory Visit: Payer: Self-pay | Admitting: Family Medicine

## 2023-01-03 MED ORDER — TIRZEPATIDE 5 MG/0.5ML ~~LOC~~ SOAJ: 5.0000 mg | SUBCUTANEOUS | 0 refills | Status: DC

## 2023-01-24 IMAGING — CT CT ABD-PELV W/ CM
2 of 5 series · 16 of 46 positions shown, 18 images · IV contrast (APPLIED)
Comparison: None.

CLINICAL DATA: Right sided abd pain

EXAM:
CT ABDOMEN AND PELVIS WITH CONTRAST
TECHNIQUE: Multidetector CT imaging of the abdomen and pelvis was performed
using the standard protocol following bolus administration of
intravenous contrast.
CONTRAST:  80mL OMNIPAQUE IOHEXOL 350 MG/ML SOLN

[Series 2: routine abd/pel with · axial · 0.75mm/px · z∈[-1003,-593]mm · 13 of 94 slices shown, 15 images]
[im 6/94  soft-tissue]
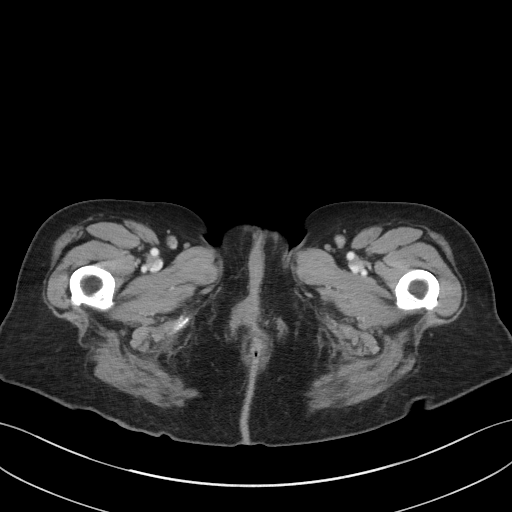
[im 6/94  bone]
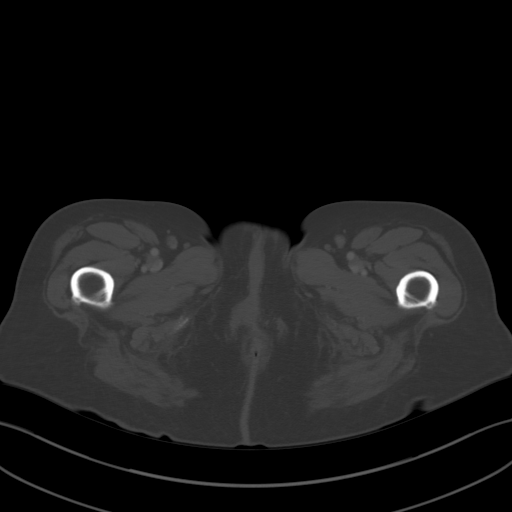
[im 11/94  soft-tissue]
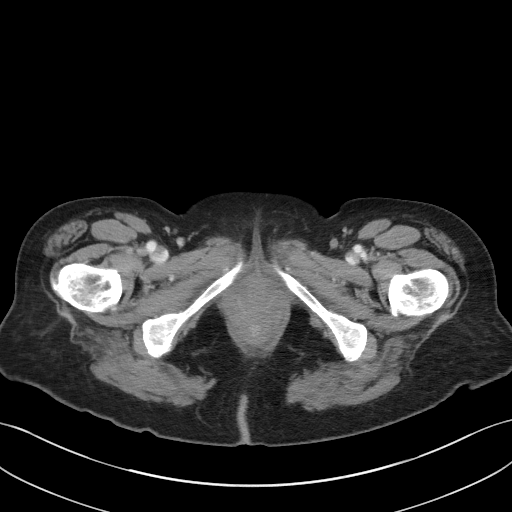
[im 21/94  soft-tissue]
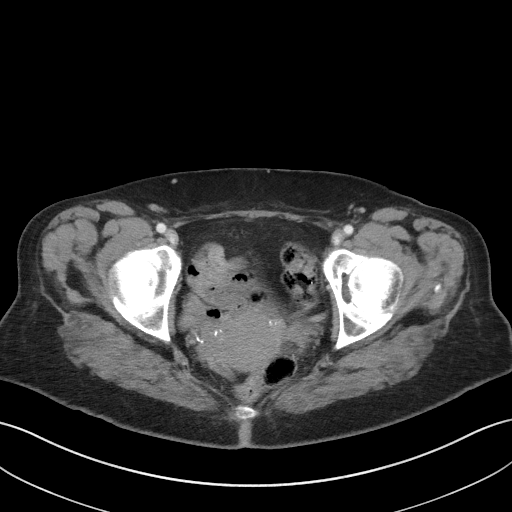
[im 26/94  soft-tissue]
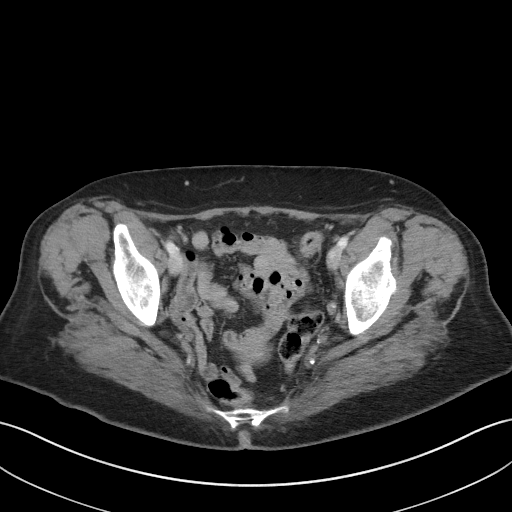
[im 32/94  soft-tissue]
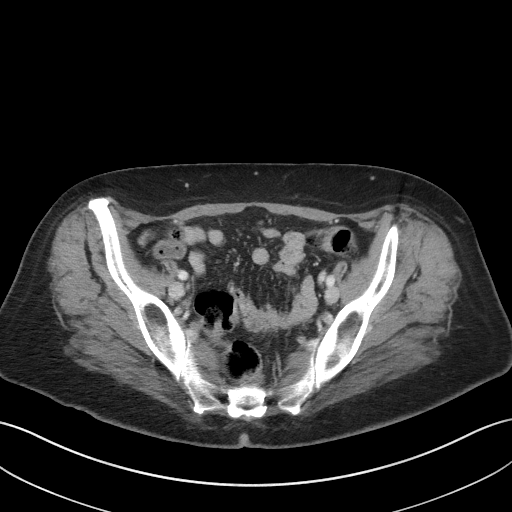
[im 42/94  soft-tissue]
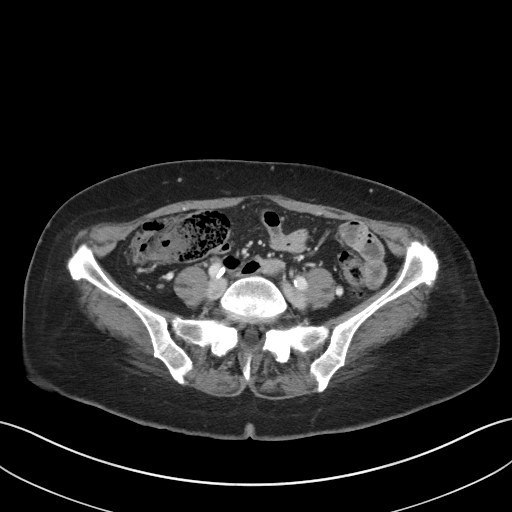
[im 47/94  soft-tissue]
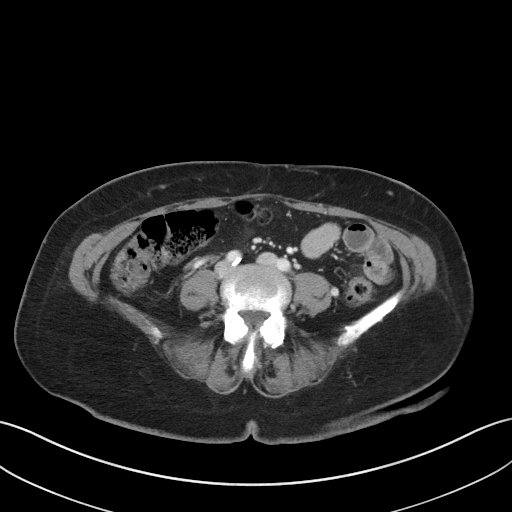
[im 52/94  soft-tissue]
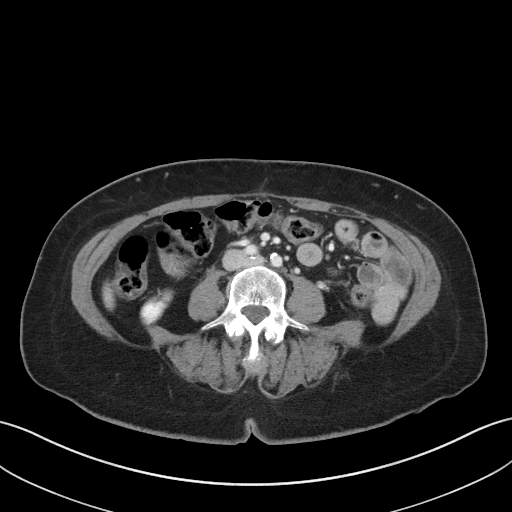
[im 63/94  soft-tissue]
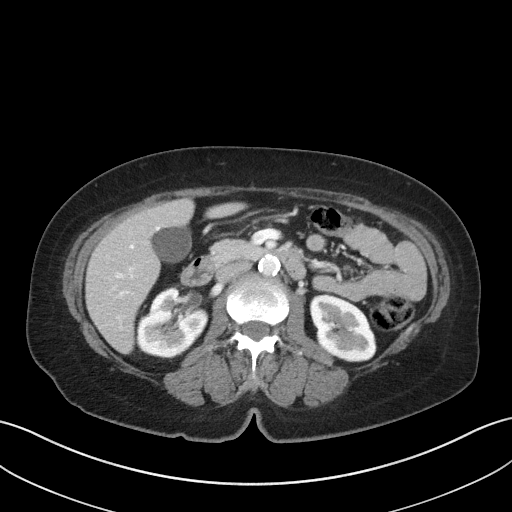
[im 63/94  bone]
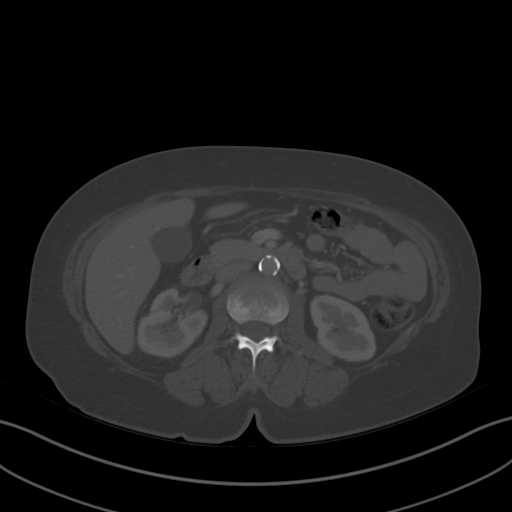
[im 68/94  soft-tissue]
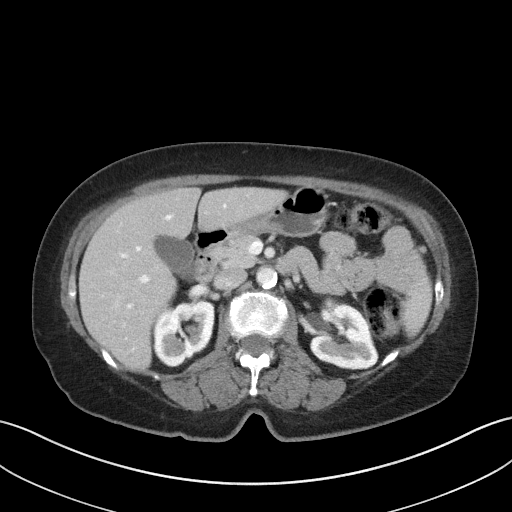
[im 73/94  soft-tissue]
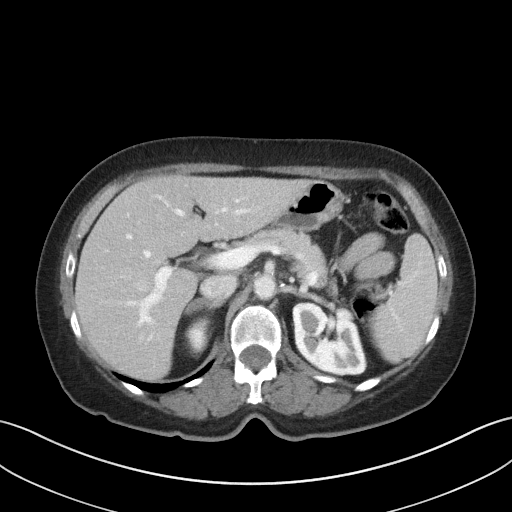
[im 83/94  soft-tissue]
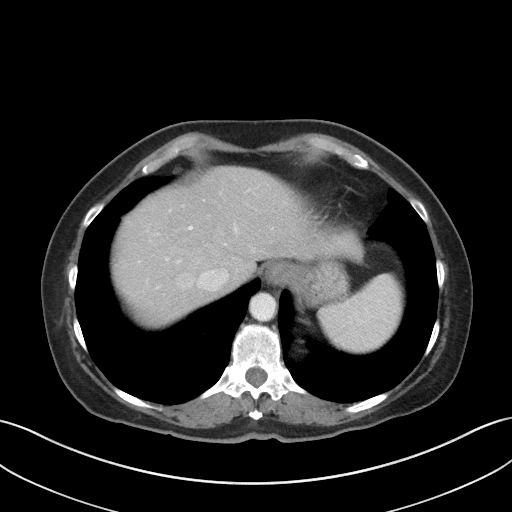
[im 88/94  soft-tissue]
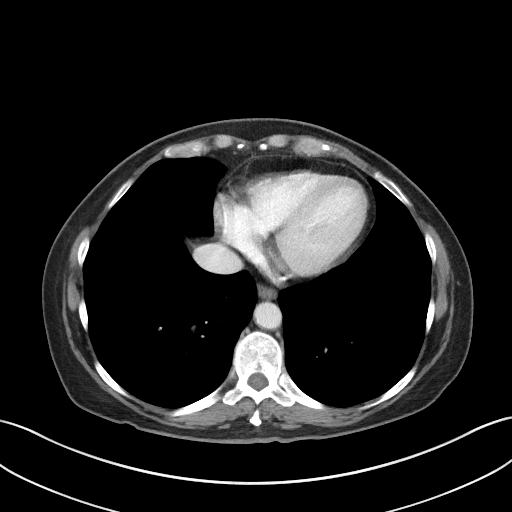

[Series 5: coronal st · coronal · 0.66mm/px · 3 of 85 slices shown]
[im 29/85  soft-tissue]
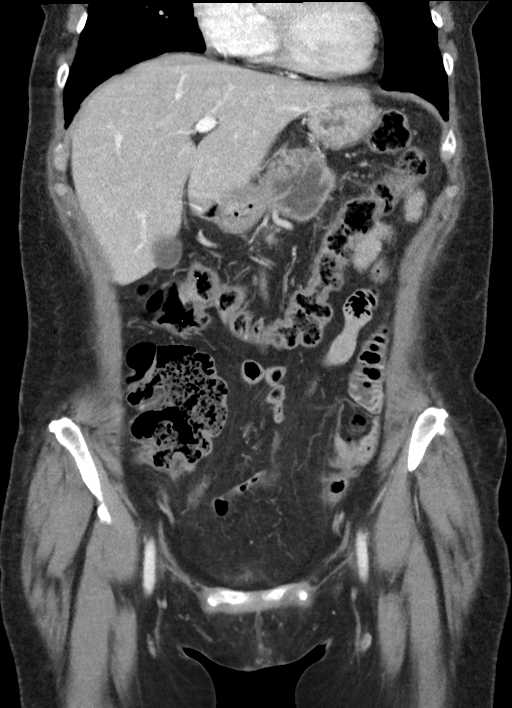
[im 38/85  soft-tissue]
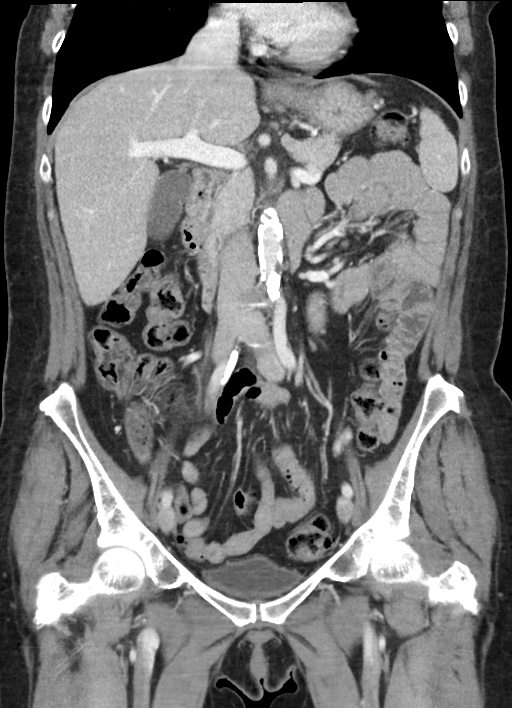
[im 47/85  soft-tissue]
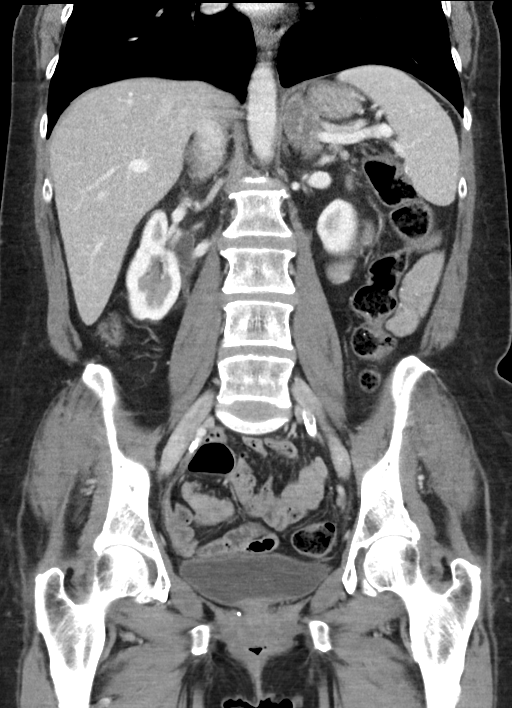

[16 of 46 positions shown; findings below may reference images not displayed]

FINDINGS: Lower chest: No acute abnormality.

Hepatobiliary: No focal liver abnormality is seen. No gallstones,
gallbladder wall thickening, or biliary dilatation.

Pancreas: Unremarkable.

Spleen: Unremarkable.

Adrenals/Urinary Tract: Bilateral adrenal nodularity. On the right
measures 3.6 cm and on the left 2 cm. Kidneys and bladder are
unremarkable.

Stomach/Bowel: Stomach is within normal limits. Bowel is normal in
caliber. Stool mildly distends the cecum. Normal appendix.

Vascular/Lymphatic: Aortic atherosclerosis. No enlarged lymph nodes.

Reproductive: Uterus and bilateral adnexa are unremarkable.

Other: No free fluid.  Abdominal wall is unremarkable.

Musculoskeletal: No acute osseous abnormality.
IMPRESSION: No acute abnormality. Stool mildly distends the cecum, which is of
unknown significance to reported symptoms.

Bilateral adrenal nodules. Statistically these likely represent
adenomas but are indeterminate.

Aortic atherosclerosis.

## 2023-02-11 ENCOUNTER — Telehealth: Payer: Self-pay | Admitting: Family Medicine

## 2023-02-11 NOTE — Telephone Encounter (Signed)
Due to possible inclement weather, Our office will be having mychart video visits on 02/12/2023. We  have  rescheduled your appointment to 02/20/2023 @ 10:20am .Sorry for any inconvenience.

## 2023-02-12 ENCOUNTER — Ambulatory Visit: Payer: 59 | Admitting: Family Medicine

## 2023-02-20 ENCOUNTER — Ambulatory Visit: Payer: 59 | Admitting: Family Medicine

## 2023-02-26 ENCOUNTER — Ambulatory Visit: Payer: 59 | Admitting: Family Medicine

## 2023-02-26 ENCOUNTER — Encounter: Payer: Self-pay | Admitting: Family Medicine

## 2023-02-26 VITALS — BP 110/63 | HR 67 | Ht 64.0 in | Wt 158.7 lb

## 2023-02-26 DIAGNOSIS — I10 Essential (primary) hypertension: Secondary | ICD-10-CM

## 2023-02-26 DIAGNOSIS — L304 Erythema intertrigo: Secondary | ICD-10-CM | POA: Diagnosis not present

## 2023-02-26 DIAGNOSIS — B3731 Acute candidiasis of vulva and vagina: Secondary | ICD-10-CM

## 2023-02-26 DIAGNOSIS — E785 Hyperlipidemia, unspecified: Secondary | ICD-10-CM

## 2023-02-26 DIAGNOSIS — E1169 Type 2 diabetes mellitus with other specified complication: Secondary | ICD-10-CM | POA: Diagnosis not present

## 2023-02-26 DIAGNOSIS — Z1231 Encounter for screening mammogram for malignant neoplasm of breast: Secondary | ICD-10-CM

## 2023-02-26 DIAGNOSIS — M542 Cervicalgia: Secondary | ICD-10-CM

## 2023-02-26 DIAGNOSIS — Z1211 Encounter for screening for malignant neoplasm of colon: Secondary | ICD-10-CM

## 2023-02-26 MED ORDER — CYCLOBENZAPRINE HCL 5 MG PO TABS: 5.0000 mg | ORAL_TABLET | Freq: Three times a day (TID) | ORAL | 1 refills | Status: DC | PRN

## 2023-02-26 MED ORDER — NYSTATIN 100000 UNIT/GM EX POWD: 1.0000 | Freq: Three times a day (TID) | CUTANEOUS | 3 refills | Status: AC

## 2023-02-26 MED ORDER — FLUCONAZOLE 150 MG PO TABS: ORAL_TABLET | ORAL | 0 refills | Status: AC

## 2023-02-26 MED ORDER — ROSUVASTATIN CALCIUM 5 MG PO TABS: 5.0000 mg | ORAL_TABLET | Freq: Every day | ORAL | 3 refills | Status: DC

## 2023-02-26 NOTE — Assessment & Plan Note (Addendum)
 Bilateral erythema present under breasts, moist and tender Appears to be yeast in nature Nystatin  powder prescribed  If symptoms worsen or persist - please f/u sooner  Wear proper fitting shirts and bras.  Keep area clean and dry Use unscented soaps and detergents Wear cotton clothing

## 2023-02-26 NOTE — Assessment & Plan Note (Signed)
 Continue metoprolol  25 BID and lisinopril  5 mg daily Chronic, stable

## 2023-02-26 NOTE — Progress Notes (Signed)
 Established Patient Office Visit  Introduced to nurse practitioner role and practice setting.  All questions answered.  Discussed provider/patient relationship and expectations.   Subjective   Patient ID: Elizabeth Hardin, female    DOB: 1960/10/03  Age: 63 y.o. MRN: [id number]  Chief Complaint  Patient presents with   Medical Management of Chronic Issues    T2DM    Elizabeth Hardin is a 63 year old female with diabetes who presents for a diabetes check-in. She is accompanied by her husband, Sherida.  DMII - jardiance  25mg . States stopped mounjaro  temporarily due to fear of side effects after she had a hypoglycemia event with repatha . She is currently taking Jardiance  25 mg daily and had previously stopped glimepiride  before Christmas - per previous provider. She plans to restart Mounjaro . Her last A1c was 8.5 three months ago.  She is taking a daily aspirin  and also on Brilinta - hx of CAD and coronary artery stent placement post MI.  Uses Flonase as needed for allergies.   For muscle spasms in her shoulders and neck, she takes Flexeril , usually once or twice a day, although it is prescribed three times a day- needs refill.  She has experienced myalgia with Lipitor  and has stopped taking it. She does not recall taking Zetia . Stopped repatha  - states caused her blood sugar to drop, felt like she was going to pass out - did not actually check BG.  HTN -  lisinopril  5 mg and metoprolol  25 mg twice a day for hypertension, which is reportedly well-controlled. She has nitroglycerin  tabs for angina, which she has used once about a year ago.   She is experiencing vaginal pruritus but no dysuria. She has tried over-the-counter treatments without success - believes she has a yeast infection. She has not changed any soaps or detergents recently. No changes in clothing. No discharge.  She reports a rash under her breasts that burns but does not itch, which she thinks yeast as well.  Health  maintenance - She has not had her eyes screened for diabetes-related changes and has not undergone a colonoscopy or mammogram.         02/26/2023    3:51 PM 11/12/2022    3:50 PM 01/31/2021   11:48 AM  Depression screen PHQ 2/9  Decreased Interest 0 0 0  Down, Depressed, Hopeless 0 0 0  PHQ - 2 Score 0 0 0  Altered sleeping 0    Tired, decreased energy 0    Change in appetite 0    Feeling bad or failure about yourself  0    Trouble concentrating 0    Moving slowly or fidgety/restless 0    Suicidal thoughts 0    PHQ-9 Score 0         02/26/2023    3:51 PM 11/12/2022    3:50 PM  GAD 7 : Generalized Anxiety Score  Nervous, Anxious, on Edge 0 0  Control/stop worrying 0 0  Worry too much - different things 0 0  Trouble relaxing 0 0  Restless 0 0  Easily annoyed or irritable 0 0  Afraid - awful might happen 0 0  Total GAD 7 Score 0 0  Anxiety Difficulty  Not difficult at all     Review of Systems  All other systems reviewed and are negative.   Negative unless indicated in HPI   Objective:     BP 110/63   Pulse 67   Ht 5' 4 (1.626 m)   Wt  158 lb 11.2 oz (72 kg)   SpO2 100%   BMI 27.24 kg/m    Physical Exam Constitutional:      General: She is not in acute distress.    Appearance: Normal appearance. She is overweight. She is not toxic-appearing or diaphoretic.  HENT:     Head: Normocephalic.     Nose: Nose normal.     Mouth/Throat:     Mouth: Mucous membranes are moist.     Pharynx: Oropharynx is clear.  Eyes:     Extraocular Movements: Extraocular movements intact.     Pupils: Pupils are equal, round, and reactive to light.  Cardiovascular:     Rate and Rhythm: Normal rate and regular rhythm.     Pulses: Normal pulses.          Dorsalis pedis pulses are 2+ on the right side and 2+ on the left side.       Posterior tibial pulses are 2+ on the right side and 2+ on the left side.     Heart sounds: Normal heart sounds. No murmur heard.    No friction rub.  No gallop.  Pulmonary:     Effort: No respiratory distress.     Breath sounds: No stridor. No wheezing, rhonchi or rales.  Chest:     Chest wall: No tenderness.  Musculoskeletal:     Right lower leg: No edema.     Left lower leg: No edema.     Right foot: Normal range of motion. No deformity, bunion, Charcot foot or foot drop.     Left foot: Normal range of motion. No deformity, bunion, Charcot foot or foot drop.  Feet:     Right foot:     Protective Sensation: 10 sites tested.  10 sites sensed.     Skin integrity: Callus and dry skin present. No ulcer, blister, skin breakdown, erythema, warmth or fissure.     Toenail Condition: Right toenails are abnormally thick.     Left foot:     Protective Sensation: 10 sites tested.  10 sites sensed.     Skin integrity: Callus and dry skin present. No ulcer, blister, skin breakdown, erythema, warmth or fissure.     Toenail Condition: Left toenails are abnormally thick.  Skin:    General: Skin is warm and dry.     Capillary Refill: Capillary refill takes less than 2 seconds.     Findings: Erythema present.     Comments: Bilateral redness under breast, moist, present. Tender Small fissure under L breast.  Neurological:     General: No focal deficit present.     Mental Status: She is alert and oriented to person, place, and time. Mental status is at baseline.  Psychiatric:        Mood and Affect: Mood normal.        Behavior: Behavior normal.        Thought Content: Thought content normal.        Judgment: Judgment normal.     No results found for any visits on 02/26/23.    The ASCVD Risk score (Arnett DK, et al., 2019) failed to calculate for the following reasons:   Risk score cannot be calculated because patient has a medical history suggesting prior/existing ASCVD    Assessment & Plan:  Type 2 diabetes mellitus with other specified complication, without long-term current use of insulin  (HCC) Assessment & Plan: Last A1C was 8.5  three months ago.  Currently on Jardiance  25mg  daily  Mounjaro  5mg  weekly was started, but pt stopped due to side effect of repatha  - she is concerned about injections.  Concerns for yeast infections secondary d/t Jardiance . -Check A1C today. -Consult with pharmacist for diabetes management. -Suggest Resuming Mounjaro  5mg  weekly for DMII - monitor for side effects - foot exam done today, pt plans to schedule eye exam On ACE and restarted statin  Orders: -     Hemoglobin A1c  Screening mammogram for breast cancer Assessment & Plan: Pt agreeable to mammogram screening order - pt to instructed to call [facility] to schedule appt.  Orders: -     3D Screening Mammogram, Left and Right; Future  Screen for colon cancer Assessment & Plan: Shared decision making  Agreeable to cologuard  Orders: -     Cologuard  Hyperlipidemia associated with type 2 diabetes mellitus (HCC) Assessment & Plan: Chronic, hx of CAD and MI with coronary stents Pt stopped atorvastatin  prior to Christmas d/t myalgias, was started on Repatha  Per pt and husband poor tolerance to Repatha  d/t nearly passing out from feeling low blood sugar. Pt and husband certain it was a reaction d/t Repatha . Pt and husband unaware of zetia  - states never took Long discussion between provider and pt- agreeable to switching to Crestor  5 mg daily for statin  Orders: -     Rosuvastatin  Calcium ; Take 1 tablet (5 mg total) by mouth daily.  Dispense: 90 tablet; Refill: 3  Neck pain Assessment & Plan: Chronic Continue use of as needed flexeril  for neck spasms Refilled, Risk discussed.  Orders: -     Cyclobenzaprine  HCl; Take 1 tablet (5 mg total) by mouth 3 (three) times daily as needed for muscle spasms.  Dispense: 90 tablet; Refill: 1  Intertrigo Assessment & Plan: Bilateral erythema present under breasts, moist and tender Appears to be yeast in nature Nystatin  powder prescribed  If symptoms worsen or persist - please f/u  sooner  Wear proper fitting shirts and bras.  Keep area clean and dry Use unscented soaps and detergents Wear cotton clothing  Orders: -     Nystatin ; Apply 1 Application topically 3 (three) times daily. Apply under breast.  Dispense: 30 g; Refill: 3  Vaginal yeast infection Assessment & Plan: Concerns for vaginal itching. Denies discharge, bleeding, dysuria, urinary frequency, urgency, flank or pelvic pain, odor, or discoloration in urine Most likely yeast related Pt on jardiance , increased risk of yeast infection Will prescribe diflucan  If symptoms persist or worsen - further evaluation needed  Wear proper pants and underwear Keep area clean and dry No douching Use unscented soaps and detergents Wear cotton underwear Breathable clothing  Orders: -     Fluconazole ; Take one tablet today, second tablet in 72 hours  Dispense: 2 tablet; Refill: 0  Essential hypertension Assessment & Plan: Continue metoprolol  25 BID and lisinopril  5 mg daily Chronic, stable     Return in about 3 months (around 05/26/2023) for DMII.   I, Curtis DELENA Boom, FNP, have reviewed all documentation for this visit. The documentation on 02/26/23 for the exam, diagnosis, procedures, and orders are all accurate and complete. Curtis DELENA Boom, FNP

## 2023-02-26 NOTE — Assessment & Plan Note (Addendum)
 Last A1C was 8.5 three months ago.  Currently on Jardiance  25mg  daily  Mounjaro  5mg  weekly was started, but pt stopped due to side effect of repatha  - she is concerned about injections.  Concerns for yeast infections secondary d/t Jardiance . -Check A1C today. -Consult with pharmacist for diabetes management. -Suggest Resuming Mounjaro  5mg  weekly for DMII - monitor for side effects - foot exam done today, pt plans to schedule eye exam On ACE and restarted statin

## 2023-02-26 NOTE — Assessment & Plan Note (Signed)
 Chronic Continue use of as needed flexeril  for neck spasms Refilled, Risk discussed.

## 2023-02-26 NOTE — Assessment & Plan Note (Addendum)
 Concerns for vaginal itching. Denies discharge, bleeding, dysuria, urinary frequency, urgency, flank or pelvic pain, odor, or discoloration in urine Most likely yeast related Pt on jardiance , increased risk of yeast infection Will prescribe diflucan  If symptoms persist or worsen - further evaluation needed  Wear proper pants and underwear Keep area clean and dry No douching Use unscented soaps and detergents Wear cotton underwear Breathable clothing

## 2023-02-26 NOTE — Assessment & Plan Note (Signed)
 Shared decision making  Agreeable to cologuard

## 2023-02-26 NOTE — Assessment & Plan Note (Signed)
 Chronic, hx of CAD and MI with coronary stents Pt stopped atorvastatin  prior to Christmas d/t myalgias, was started on Repatha  Per pt and husband poor tolerance to Repatha  d/t nearly passing out from feeling low blood sugar. Pt and husband certain it was a reaction d/t Repatha . Pt and husband unaware of zetia  - states never took Long discussion between provider and pt- agreeable to switching to Crestor  5 mg daily for statin

## 2023-02-26 NOTE — Assessment & Plan Note (Signed)
 Pt agreeable to mammogram screening order - pt to instructed to call [facility] to schedule appt.

## 2023-02-27 ENCOUNTER — Telehealth: Payer: Self-pay

## 2023-02-27 LAB — HEMOGLOBIN A1C
Est. average glucose Bld gHb Est-mCnc: 217 mg/dL
Hgb A1c MFr Bld: 9.2 % — ABNORMAL HIGH (ref 4.8–5.6)

## 2023-02-27 NOTE — Progress Notes (Signed)
 Care Guide Pharmacy Note  02/27/2023 Name: Elizabeth Hardin MRN: [id number] DOB: Jul 06, 1960  Referred By: Wellington Curtis LABOR, FNP Reason for referral: Care Coordination (Outreach to schedule with pharm d )   Elizabeth Hardin is a 63 y.o. year old female who is a primary care patient of Wellington Curtis LABOR, FNP.  Elizabeth Hardin was referred to the pharmacist for assistance related to: DMII  An unsuccessful telephone outreach was attempted today to contact the patient who was referred to the pharmacy team for assistance with medication management. Additional attempts will be made to contact the patient.  Jeoffrey Buffalo , RMA     [facility]  [facility], [facility] Guide  Direct Dial: [phone]  Website: [email or web address]

## 2023-02-27 NOTE — Progress Notes (Signed)
 Please call patient with results.   Increased A1c - 9.2 from 8.5. Uncontrolled diabetes. Continue to take Jardiance  25mg  daily. Restart mounjaro  5mg  weekly. Consult made to Pharmacy for assistance on medication mgmt. Consult to Nutrition/diabetes services for diet assistance.

## 2023-03-04 NOTE — Progress Notes (Signed)
Care Guide Pharmacy Note  03/04/2023 Name: Elizabeth Hardin MRN: [id number] DOB: Sep 04, 1960  Referred By: Sallee Provencal, FNP Reason for referral: Care Coordination (Outreach to schedule with pharm d )   Elizabeth Hardin is a 63 y.o. year old female who is a primary care patient of Sallee Provencal, FNP.  Elizabeth Hardin was referred to the pharmacist for assistance related to: HLD and DMII  A second unsuccessful telephone outreach was attempted today to contact the patient who was referred to the pharmacy team for assistance with medication management. Additional attempts will be made to contact the patient.  Penne Lash , RMA     [facility]  [facility], [facility] Guide  Direct Dial: [phone]  Website: [email or web address] +

## 2023-03-10 NOTE — Progress Notes (Signed)
Care Guide Pharmacy Note  03/10/2023 Name: SHELBEE APGAR MRN: [id number] DOB: [birth year]  Referred By: Sallee Provencal, FNP Reason for referral: Care Coordination (Outreach to schedule with pharm d )   EURA MCCAUSLIN is a 63 y.o. year old female who is a primary care patient of Sallee Provencal, FNP.  [name] was referred to the pharmacist for assistance related to: HLD and DMII  A third unsuccessful telephone outreach was attempted today to contact the patient who was referred to the pharmacy team for assistance with medication management. The Population Health team is pleased to engage with this patient at any time in the future upon receipt of referral and should he/she be interested in assistance from the [employer] [facility] team.  Penne Lash , RMA     [facility]  [facility], [facility] Guide  Direct Dial: [phone]  Website: [email or web address]

## 2023-03-11 ENCOUNTER — Other Ambulatory Visit: Payer: Self-pay | Admitting: Family Medicine

## 2023-03-11 NOTE — Telephone Encounter (Signed)
Medication Refill -  Most Recent Primary Care Visit:  Provider: Merita Norton T  Department: [facility]  Visit Type: NEW PATIENT  Date: 11/12/2022  Medication: tirzepatide [facility]) 5 MG/0.5ML Pen   Has the patient contacted their pharmacy? Yes   Is this the correct pharmacy for this prescription? Yes If no, delete pharmacy and type the correct one.  This is the patient's preferred pharmacy:   [facility] [place], [place] - [address] [address] Phone: [phone]  Fax: [phone]      Has the prescription been filled recently? No  Is the patient out of the medication? Yes  Has the patient been seen for an appointment in the last year OR does the patient have an upcoming appointment? Yes  Can we respond through MyChart? No  Please assist patient further

## 2023-03-12 NOTE — Telephone Encounter (Signed)
Requested medication (s) are due for refill today - yes  Requested medication (s) are on the active medication list -yes  Future visit scheduled -yes  Last refill: 01/03/23 2ml  Notes to clinic: off protocol- provider review   Requested Prescriptions  Pending Prescriptions Disp Refills   tirzepatide (MOUNJARO) 5 MG/0.5ML Pen 2 mL 0    Sig: Inject 5 mg into the skin once a week.     Off-Protocol Failed - 03/12/2023 12:45 PM      Failed - Medication not assigned to a protocol, review manually.      Passed - Valid encounter within last 12 months    Recent Outpatient Visits           4 months ago Annual physical exam   [facility] Jacky Kindle, FNP       Future Appointments             In 3 months Sallee Provencal, FNP [facility], [facility]               Requested Prescriptions  Pending Prescriptions Disp Refills   tirzepatide [facility]) 5 MG/0.5ML Pen 2 mL 0    Sig: Inject 5 mg into the skin once a week.     Off-Protocol Failed - 03/12/2023 12:45 PM      Failed - Medication not assigned to a protocol, review manually.      Passed - Valid encounter within last 12 months    Recent Outpatient Visits           4 months ago Annual physical exam   [facility] Jacky Kindle, FNP       Future Appointments             In 3 months Ephriam Knuckles, Jennette Kettle, FNP [facility], [facility]

## 2023-03-13 MED ORDER — TIRZEPATIDE 5 MG/0.5ML ~~LOC~~ SOAJ
5.0000 mg | SUBCUTANEOUS | 1 refills | Status: DC
Start: 2023-03-13 — End: 2023-05-12

## 2023-03-20 ENCOUNTER — Other Ambulatory Visit: Payer: Self-pay | Admitting: Pharmacist

## 2023-03-20 NOTE — Progress Notes (Signed)
 03/20/2023 Name: Elizabeth Hardin MRN: [id number] DOB: 09/18/1960  Chief Complaint  Patient presents with   Diabetes   Hyperlipidemia    Elizabeth Hardin is a 63 y.o. year old female who presented for a telephone visit.   They were referred to the pharmacist by their PCP for assistance in managing diabetes and hyperlipidemia.    Subjective:  Care Team: Primary Care Provider: Sallee Provencal, FNP ; Next Scheduled Visit: 06/18/23 Clinical Pharmacist: Marlowe Aschoff, PharmD  Medication Access/Adherence  Current Pharmacy:  [facility] [place], [place] - [address] [address] [address] [address] [place] [address] Phone: [phone] Fax: [phone]  [facility] [address] [place] - [address] [place] [address] Phone: [phone] Fax: [phone]   Patient reports affordability concerns with their medications: No  Patient reports access/transportation concerns to their pharmacy: No  Patient reports adherence concerns with their medications:  No     Diabetes:  Current medications: Jardiance 25mg  daily Medications tried in the past: Invokana 100mg , Glimepiride 2mg , Glipizide XL 2.5mg , metformin  Current glucose readings: 176, 152, 143 (from husband recollection); anywhere from 140-180 Using Livongo meter; testing 1 time daily in the morning- getting for free   Patient denies hypoglycemic s/sx including dizziness, shakiness, sweating. Patient denies hyperglycemic symptoms including polyuria, polydipsia, polyphagia, nocturia, neuropathy, blurred vision.  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  Current medication access support: Art gallery manager    Hyperlipidemia/ASCVD Risk Reduction  Current lipid lowering medications: Rosuvastatin 5mg  daily Medications tried in the past: Atorvastatin (myalgia in 2023), Repatha (happened twice)  Antiplatelet regimen: Aspirin 81mg  daily  ASCVD History: MI in 01/2021 Family History: CAD Risk Factors: Unknown   The ASCVD Risk score (Arnett DK, et al., 2019) failed to calculate for the following reasons:   Risk score cannot be calculated because patient has a medical history suggesting prior/existing ASCVD    Objective:  Lab Results  Component Value Date   HGBA1C 9.2 (H) 02/26/2023    Lab Results  Component Value Date   CREATININE 1.01 (H) 11/12/2022   BUN 29 (H) 11/12/2022   NA 141 11/12/2022   K 5.1 11/12/2022   CL 107 (H) 11/12/2022   CO2 18 (L) 11/12/2022    Lab Results  Component Value Date   CHOL 200 (H) 11/12/2022   HDL 49 11/12/2022   LDLCALC 120 (H) 11/12/2022   TRIG 179 (H) 11/12/2022   CHOLHDL 4.1 11/12/2022    Medications Reviewed Today     Reviewed by Linna Darner, RPH (Pharmacist) on 03/20/23 at 1446  Med List Status: <None>   Medication Order Taking? Sig Documenting Provider Last Dose Status Informant  aspirin 81 MG chewable tablet [id number] Yes Chew 1 tablet (81 mg total) by mouth daily. Kathrynn Running, MD Taking Active   Azelastine-Fluticasone 137-50 MCG/ACT Santina Evans [id number] Yes Place 1 spray into the nose every 12 (twelve) hours. Jacky Kindle, FNP Taking Active   cyclobenzaprine (FLEXERIL) 5 MG tablet [id number] Yes Take 1 tablet (5 mg total) by mouth 3 (three) times daily as needed for muscle spasms. Sallee Provencal, FNP Taking Active   empagliflozin (JARDIANCE) 25 MG TABS tablet [id number] Yes Take 1 tablet (25  mg total) by mouth daily before breakfast. Jacky Kindle, FNP Taking Active   fluconazole (DIFLUCAN) 150 MG tablet [id number] Yes Take one tablet today, second tablet in 72 hours Sallee Provencal, FNP Taking Active    fluticasone (FLONASE) 50 MCG/ACT nasal spray [id number] Yes Place 2 sprays into both nostrils daily. [provider] Taking Active   lisinopril (ZESTRIL) 5 MG tablet [id number] Yes Take 1 tablet by mouth daily. [provider] Taking Active   metoprolol tartrate (LOPRESSOR) 25 MG tablet [id number] Yes Take 1 tablet (25 mg total) by mouth 2 (two) times daily. Wouk, Wilfred Curtis, MD Taking Active   nitroGLYCERIN (NITROSTAT) 0.4 MG SL tablet [id number] Yes Place 1 tablet (0.4 mg total) under the tongue every 5 (five) minutes as needed for chest pain (Call 9-1-1 if you take your third medication). Jacky Kindle, FNP Taking Active   nystatin (MYCOSTATIN/NYSTOP) powder [id number] Yes Apply 1 Application topically 3 (three) times daily. Apply under breast. Sallee Provencal, FNP Taking Active   rosuvastatin (CRESTOR) 5 MG tablet [id number] Yes Take 1 tablet (5 mg total) by mouth daily. Sallee Provencal, FNP Taking Active   ticagrelor (BRILINTA) 90 MG TABS tablet [id number] Yes Take 1 tablet (90 mg total) by mouth 2 (two) times daily. Wouk, Wilfred Curtis, MD Taking Active   tirzepatide [facility]) 5 MG/0.5ML Pen [id number] Yes Inject 5 mg into the skin once a week. Sallee Provencal, FNP Taking Active            Med Note Valentina Lucks, Jameek Bruntz M   Thu Mar 20, 2023  2:40 PM) Getting free from [facility] at [phone]              Assessment/Plan:   Diabetes: - Currently uncontrolled - Reviewed long term cardiovascular and renal outcomes of uncontrolled blood sugar - Reviewed goal A1c, goal fasting, and goal 2 hour post prandial glucose - Reviewed dietary modifications including minimizing snacks and switching chips for healthier alternatives like fruit/vegetables - Recommend to check glucose daily- getting for free from [employer]   Hyperlipidemia/ASCVD Risk Reduction: - Currently uncontrolled.  - Reviewed long term complications of uncontrolled cholesterol - Continue taking  Rosuvastatin 5mg  daily- doing well currently; will work on slow titration up to avoid myalgias   Follow Up Plan:  - Follow-up call on 04/10/23 to see how Rosuvastatin is doing (plan to increase to 10mg  daily; slow titration) - Getting Mounjaro from [facility] at [phone]; has been out of medication for 1 week  - Called and said they will get started processing it - Can continue to increase dose of Mounjaro as needed in the future - Refuses Repatha due to history of low BG when trialing it twice   Marlowe Aschoff, PharmD [facility] Phone Number: [phone]

## 2023-04-08 NOTE — Telephone Encounter (Signed)
 Copied from CRM [id number]. Topic: Clinical - Medication Refill >> Apr 08, 2023 12:19 PM Payton Doughty wrote: Most Recent Primary Care Visit:  Provider: Charlcie Cradle A  Department: [facility]  Visit Type: OFFICE VISIT  Date: 02/26/2023  Medication: empagliflozin (JARDIANCE) 25 MG TABS tablet  Has the patient contacted their pharmacy? Yes (Agent: If no, request that the patient contact the pharmacy for the refill. If patient does not wish to contact the pharmacy document the reason why and proceed with request.) (Agent: If yes, when and what did the pharmacy advise?)  Is this the correct pharmacy for this prescription? Yes If no, delete pharmacy and type the correct one.  This is the patient's preferred pharmacy:  [facility] [place], [place] - [address] [address] [address] [address] [place] [address] Phone: [phone] Fax: [phone]   Has the prescription been filled recently? Yes  Is the patient out of the medication? Yes  Has the patient been seen for an appointment in the last year OR does the patient have an upcoming appointment? Yes  Can we respond through MyChart? Yes  Pt would like you to move the remaining refills to the new pharmancy

## 2023-04-10 ENCOUNTER — Other Ambulatory Visit: Payer: Self-pay | Admitting: Pharmacist

## 2023-04-10 NOTE — Progress Notes (Addendum)
 04/10/2023 Name: Elizabeth Hardin MRN: [id number] DOB: Oct 24, 1960  Chief Complaint  Patient presents with   Medication Management    Elizabeth Hardin is a 63 y.o. year old female who presented for a telephone visit.   They were referred to the pharmacist by their PCP for assistance in managing diabetes and hyperlipidemia.    Subjective:  Care Team: Primary Care Provider: Sallee Provencal, FNP ; Next Scheduled Visit: 06/18/23 Clinical Pharmacist: Marlowe Aschoff, PharmD  Medication Access/Adherence  Current Pharmacy:  [facility] [place], [place] - [address] [address] [address] [address] [place] [address] Phone: [phone] Fax: [phone]   Patient reports affordability concerns with their medications: No  Patient reports access/transportation concerns to their pharmacy: No  Patient reports adherence concerns with their medications:  No     Diabetes:  Current medications: Jardiance 25mg  daily, Mounjaro 5mg  weekly Medications tried in the past: Invokana 100mg , Glimepiride 2mg , Glipizide XL 2.5mg , metformin  Current glucose readings: 176, 152, 143 (from husband recollection); anywhere from 140-180 Using [employer] meter; testing 1 time daily in the morning- getting for free   Patient denies hypoglycemic s/sx including dizziness, shakiness, sweating. Patient denies hyperglycemic symptoms including polyuria, polydipsia, polyphagia, nocturia, neuropathy, blurred vision.  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    Hyperlipidemia/ASCVD Risk Reduction  Current lipid lowering medications: Rosuvastatin 5mg  daily Medications tried  in the past: Atorvastatin (myalgia in 2023), Repatha (happened twice)  Antiplatelet regimen: Aspirin 81mg  daily  ASCVD History: MI in 01/2021 Family History: CAD Risk Factors: Unknown   The ASCVD Risk score (Arnett DK, et al., 2019) failed to calculate for the following reasons:   Risk score cannot be calculated because patient has a medical history suggesting prior/existing ASCVD    Objective:  Lab Results  Component Value Date   HGBA1C 9.2 (H) 02/26/2023    Lab Results  Component Value Date   CREATININE 1.01 (H) 11/12/2022   BUN 29 (H) 11/12/2022   NA 141 11/12/2022   K 5.1 11/12/2022   CL 107 (H) 11/12/2022   CO2 18 (L) 11/12/2022    Lab Results  Component Value Date   CHOL 200 (H) 11/12/2022   HDL 49 11/12/2022   LDLCALC 120 (H) 11/12/2022   TRIG 179 (H) 11/12/2022   CHOLHDL 4.1 11/12/2022    Medications Reviewed Today   Medications were not reviewed in this encounter       Assessment/Plan:   Diabetes: - Currently uncontrolled - Reviewed long term cardiovascular and renal outcomes of uncontrolled blood sugar - Reviewed goal A1c, goal fasting, and goal 2 hour post prandial glucose - Reviewed dietary modifications including minimizing snacks and switching chips for healthier alternatives like fruit/vegetables - Recommend to check glucose daily- getting for free from [employer]   Hyperlipidemia/ASCVD Risk Reduction: - Currently uncontrolled.  - Reviewed long term complications of uncontrolled cholesterol - Continue taking Rosuvastatin 5mg  daily- doing well currently; will work on slow titration up to avoid myalgias   Follow Up Plan:  - Follow-up call on 04/24/23 to see how Rosuvastatin is doing (increased to 10mg  daily; slow titration)  - Send new Rx to the pharmacy if doing well - Getting [facility]  from [facility] at [phone]; has restarted the medication and taken 2 doses already - Can continue to increase dose of Mounjaro as needed in the future-  dose increase date would be 05/24/23 if taking according to current supply to [facility] 7.5mg  weekly  - Uses pen that looks like below- has 2 doses in 1 pen and unopened pen of 5mg  as well - Refuses Repatha due to history of low BG when trialing it twice - Plans to switch Jardiance and Brillinta to [facility] as well - Has been out of Brillinta for awhile now- having difficulty getting at [facility] while new pharmacy gets it   [facility] pharmacy to see issue- needs PA; submitted today to [facility]  Update from 04/16/23: - Patient received Brilinta on 04/10/23 for 1 month fill prior to enrollment with required mail order pharmacy of [facility]  - PA department [phone]; PA approved: [id number], 04/07/23 start date- 04/06/24 end date     Marlowe Aschoff, PharmD [facility] Phone Number: [phone]

## 2023-04-15 ENCOUNTER — Other Ambulatory Visit: Payer: Self-pay | Admitting: Family Medicine

## 2023-04-15 DIAGNOSIS — E1169 Type 2 diabetes mellitus with other specified complication: Secondary | ICD-10-CM

## 2023-04-15 MED ORDER — EMPAGLIFLOZIN 25 MG PO TABS: 25.0000 mg | ORAL_TABLET | Freq: Every day | ORAL | 0 refills | Status: DC

## 2023-04-15 NOTE — Telephone Encounter (Signed)
 [facility] faxed refill request for the following medications:   empagliflozin (JARDIANCE) 25 MG TABS tablet      Please advise.

## 2023-04-24 ENCOUNTER — Other Ambulatory Visit: Payer: Self-pay | Admitting: Pharmacist

## 2023-04-24 DIAGNOSIS — E1169 Type 2 diabetes mellitus with other specified complication: Secondary | ICD-10-CM

## 2023-04-24 MED ORDER — ROSUVASTATIN CALCIUM 10 MG PO TABS
10.0000 mg | ORAL_TABLET | Freq: Every day | ORAL | 0 refills | Status: DC
Start: 2023-04-24 — End: 2023-07-23

## 2023-04-24 NOTE — Progress Notes (Signed)
 04/24/2023 Name: Elizabeth Hardin MRN: [id number] DOB: 12/10/1960  Chief Complaint  Patient presents with   Medication Management    Elizabeth Hardin is a 63 y.o. year old female who presented for a telephone visit.   They were referred to the pharmacist by their PCP for assistance in managing diabetes and hyperlipidemia.    Subjective:  Care Team: Primary Care Provider: Sallee Provencal, FNP ; Next Scheduled Visit: 06/18/23 Clinical Pharmacist: Marlowe Aschoff, PharmD  Medication Access/Adherence  Current Pharmacy:  [facility] [place], [place] - [address] [address] [address] [address] [place] [address] Phone: [phone] Fax: [phone]   Patient reports affordability concerns with their medications: No  Patient reports access/transportation concerns to their pharmacy: No  Patient reports adherence concerns with their medications:  No     Diabetes:  Current medications: Jardiance 25mg  daily, Mounjaro 5mg  weekly Medications tried in the past: Invokana 100mg , Glimepiride 2mg , Glipizide XL 2.5mg , metformin  Current glucose readings: 176, 152, 143 (from husband recollection); anywhere from 140-180 Using [employer] meter; testing 1 time daily in the morning- getting for free   Patient denies hypoglycemic s/sx including dizziness, shakiness, sweating. Patient denies hyperglycemic symptoms including polyuria, polydipsia, polyphagia, nocturia, neuropathy, blurred vision.  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    Hyperlipidemia/ASCVD Risk Reduction  Current lipid lowering medications: Rosuvastatin 10mg  daily (taking two 5mg   tablets) Medications tried in the past: Atorvastatin (myalgia in 2023), Repatha (happened twice)  Antiplatelet regimen: Aspirin 81mg  daily  ASCVD History: MI in 01/2021 Family History: CAD Risk Factors: Unknown   The ASCVD Risk score (Arnett DK, et al., 2019) failed to calculate for the following reasons:   Risk score cannot be calculated because patient has a medical history suggesting prior/existing ASCVD    Objective:  Lab Results  Component Value Date   HGBA1C 9.2 (H) 02/26/2023    Lab Results  Component Value Date   CREATININE 1.01 (H) 11/12/2022   BUN 29 (H) 11/12/2022   NA 141 11/12/2022   K 5.1 11/12/2022   CL 107 (H) 11/12/2022   CO2 18 (L) 11/12/2022    Lab Results  Component Value Date   CHOL 200 (H) 11/12/2022   HDL 49 11/12/2022   LDLCALC 120 (H) 11/12/2022   TRIG 179 (H) 11/12/2022   CHOLHDL 4.1 11/12/2022    Medications Reviewed Today   Medications were not reviewed in this encounter       Assessment/Plan:   Diabetes: - Currently uncontrolled - Reviewed long term cardiovascular and renal outcomes of uncontrolled blood sugar - Reviewed goal A1c, goal fasting, and goal 2 hour post prandial glucose - Reviewed dietary modifications including minimizing snacks and switching chips for healthier alternatives like fruit/vegetables - Recommend to check glucose daily- getting for free from [employer]   Hyperlipidemia/ASCVD Risk Reduction: - Currently uncontrolled.  - Reviewed long term complications of uncontrolled cholesterol - Continue taking Rosuvastatin 10mg  daily- doing well currently; will work on slow titration up to avoid myalgias   Follow Up Plan:  - Follow-up call on 05/23/23 to see if any further concerns - Getting Mounjaro from [facility] at [phone] - Can continue to increase dose  of Mounjaro as needed in the future- dose increase date would be 05/24/23 if taking according to current supply to [facility] 7.5mg  weekly - Refuses Repatha  due to history of low BG when trialing it twice - Will be getting Dewayne Shorter, and Brillinta at [facility] as well - Updated Rx for Rosuvastatin 10mg  daily sent to [facility] since patient is doing well  -Patient at [facility] currently for diarrhea concerns- labs are completed but going to get stomach scan now  - Weight loss concern: 158 lbs on 02/26/23; 148 lbs today (10 lbs in 2 months)  - On Ozempic since first week of March and Rosuvastatin increase 1 week ago as potential causes    Marlowe Aschoff, PharmD [facility] Phone Number: [phone]

## 2023-05-12 MED ORDER — TIRZEPATIDE 7.5 MG/0.5ML ~~LOC~~ SOAJ: 7.5000 mg | SUBCUTANEOUS | 0 refills | Status: DC

## 2023-05-12 NOTE — Addendum Note (Signed)
 Addended by: Yechezkel Fertig M on: 05/12/2023 08:16 AM   Modules accepted: Orders

## 2023-05-23 ENCOUNTER — Other Ambulatory Visit: Payer: Self-pay | Admitting: Pharmacist

## 2023-05-26 NOTE — Progress Notes (Signed)
 05/26/2023 Name: Elizabeth Hardin MRN: [id number] DOB: [birth year]  Chief Complaint  Patient presents with   Medication Management    Elizabeth Hardin is a 63 y.o. year old female who presented for a telephone visit.   They were referred to the pharmacist by their PCP for assistance in managing diabetes and hyperlipidemia.    Subjective:  Care Team: Primary Care Provider: Tasia Farr, FNP ; Next Scheduled Visit: 06/18/23 Clinical Pharmacist: Delvin File, PharmD  Medication Access/Adherence  Current Pharmacy:  [facility] [place], [place] - [address] [address] [address] [address] [place] [address] Phone: [phone] Fax: [phone]   Patient reports affordability concerns with their medications: No  Patient reports access/transportation concerns to their pharmacy: No  Patient reports adherence concerns with their medications:  No     Diabetes:  Current medications: Jardiance  25mg  daily, Mounjaro 5mg  weekly (7.5mg  script sent to pharmacy) Medications tried in the past: Invokana  100mg , Glimepiride  2mg , Glipizide  XL 2.5mg , metformin  Current glucose readings: 176, 152, 143 (from husband recollection); anywhere from 140-180 Using [employer] meter; testing 1 time daily in the morning- getting for free   Patient denies hypoglycemic s/sx including dizziness, shakiness, sweating. Patient denies hyperglycemic symptoms including polyuria, polydipsia, polyphagia, nocturia, neuropathy, blurred vision.  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    Hyperlipidemia/ASCVD Risk Reduction  Current lipid lowering medications:  Rosuvastatin  10mg  daily Medications tried in the past: Atorvastatin  (myalgia in 2023), Repatha  (happened twice)  Antiplatelet regimen: Aspirin  81mg  daily  ASCVD History: MI in 01/2021 Family History: CAD Risk Factors: Unknown   The ASCVD Risk score (Arnett DK, et al., 2019) failed to calculate for the following reasons:   Risk score cannot be calculated because patient has a medical history suggesting prior/existing ASCVD    Objective:  Lab Results  Component Value Date   HGBA1C 9.2 (H) 02/26/2023    Lab Results  Component Value Date   CREATININE 1.01 (H) 11/12/2022   BUN 29 (H) 11/12/2022   NA 141 11/12/2022   K 5.1 11/12/2022   CL 107 (H) 11/12/2022   CO2 18 (L) 11/12/2022    Lab Results  Component Value Date   CHOL 200 (H) 11/12/2022   HDL 49 11/12/2022   LDLCALC 120 (H) 11/12/2022   TRIG 179 (H) 11/12/2022   CHOLHDL 4.1 11/12/2022    Medications Reviewed Today   Medications were not reviewed in this encounter       Assessment/Plan:   Diabetes: - Currently uncontrolled - Reviewed long term cardiovascular and renal outcomes of uncontrolled blood sugar - Reviewed goal A1c, goal fasting, and goal 2 hour post prandial glucose - Reviewed dietary modifications including minimizing snacks and switching chips for healthier alternatives like fruit/vegetables - Recommend to check glucose daily- getting for free from [employer]   Hyperlipidemia/ASCVD Risk Reduction: - Currently uncontrolled.  - Reviewed long term complications of uncontrolled cholesterol - Continue taking Rosuvastatin  5mg  daily- doing well currently; will work on slow titration up to avoid myalgias   Follow Up Plan:  - Follow-up in 2 months to see if PCP visit completed - Advised to call office because upcoming visit was cancelled by PCP  - Has  diarrhea/nausea concerns- appears had a potential UTI and was prescribed antibiotics - Getting Mounjaro from [facility] at [phone] - Believe  patient may still have Mounjaro 5mg  weekly still - Refuses Repatha  due to history of low BG when trialing it twice - Mounjaro and Brillinta come from [facility]      Delvin File, PharmD [facility] Phone Number: [phone]

## 2023-06-06 ENCOUNTER — Ambulatory Visit: Admitting: Family Medicine

## 2023-06-06 ENCOUNTER — Encounter: Payer: Self-pay | Admitting: Family Medicine

## 2023-06-06 VITALS — BP 124/67 | HR 79 | Ht 64.0 in | Wt 143.0 lb

## 2023-06-06 DIAGNOSIS — R630 Anorexia: Secondary | ICD-10-CM

## 2023-06-06 DIAGNOSIS — I10 Essential (primary) hypertension: Secondary | ICD-10-CM | POA: Diagnosis not present

## 2023-06-06 DIAGNOSIS — I152 Hypertension secondary to endocrine disorders: Secondary | ICD-10-CM

## 2023-06-06 DIAGNOSIS — E1159 Type 2 diabetes mellitus with other circulatory complications: Secondary | ICD-10-CM | POA: Diagnosis not present

## 2023-06-06 DIAGNOSIS — E1169 Type 2 diabetes mellitus with other specified complication: Secondary | ICD-10-CM | POA: Diagnosis not present

## 2023-06-06 DIAGNOSIS — E875 Hyperkalemia: Secondary | ICD-10-CM

## 2023-06-06 MED ORDER — TIRZEPATIDE 2.5 MG/0.5ML ~~LOC~~ SOAJ: 2.5000 mg | SUBCUTANEOUS | 3 refills | Status: DC

## 2023-06-06 NOTE — Progress Notes (Signed)
 Established patient visit   Patient: Elizabeth Hardin   DOB: 01-22-1961   63 y.o. Female  MRN: [id number] Visit Date: 06/06/2023  Today's healthcare provider: Mimi Alt, MD   Chief Complaint  Patient presents with   Dehydration    She went to Urgent care, and was told her was Dehydrate and was advised to drink more water, this has been going on for about 4 weeks or so. She hasn't been drinking liquids because she dont want to    Subjective     HPI     Dehydration    Additional comments: She went to Urgent care, and was told her was Dehydrate and was advised to drink more water, this has been going on for about 4 weeks or so. She hasn't been drinking liquids because she dont want to       Last edited by Bart Lieu, CMA on 06/06/2023  8:48 AM.       Discussed the use of AI scribe software for clinical note transcription with the patient, who gave verbal consent to proceed.  History of Present Illness Elizabeth Hardin is a 63 year old female with hypertension, diabetes, and coronary artery disease who presents with concern for dehydration.  She has been experiencing symptoms of dehydration for about a month, including dry skin, dry mouth, and increased urination. She lacks the desire to drink more fluids despite being advised to increase her water intake. She also reports a decreased appetite and lack of thirst.  She is currently taking lisinopril  5 mg daily, Jardiance  25 mg daily, and Mounjaro 5 mg weekly. No recent vomiting or diarrhea, although she had an episode of watery diarrhea which prompted her visit to urgent care.     Past Medical History:  Diagnosis Date   Allergy    Coronary artery disease    Diabetes mellitus without complication (HCC)    Hyperlipidemia    Hypertension    Neck pain     Medications: Outpatient Medications Prior to Visit  Medication Sig   aspirin  81 MG chewable tablet Chew 1 tablet (81 mg total) by mouth  daily.   cyclobenzaprine  (FLEXERIL ) 5 MG tablet Take 1 tablet (5 mg total) by mouth 3 (three) times daily as needed for muscle spasms.   empagliflozin  (JARDIANCE ) 25 MG TABS tablet Take 1 tablet (25 mg total) by mouth daily before breakfast.   lisinopril  (ZESTRIL ) 5 MG tablet Take 1 tablet by mouth daily.   metoprolol  tartrate (LOPRESSOR ) 25 MG tablet Take 1 tablet (25 mg total) by mouth 2 (two) times daily.   nitroGLYCERIN  (NITROSTAT ) 0.4 MG SL tablet Place 1 tablet (0.4 mg total) under the tongue every 5 (five) minutes as needed for chest pain (Call 9-1-1 if you take your third medication).   rosuvastatin  (CRESTOR ) 10 MG tablet Take 1 tablet (10 mg total) by mouth daily.   ticagrelor  (BRILINTA ) 90 MG TABS tablet Take 1 tablet (90 mg total) by mouth 2 (two) times daily.   [DISCONTINUED] tirzepatide (MOUNJARO) 7.5 MG/0.5ML Pen Inject 7.5 mg into the skin once a week.   Azelastine -Fluticasone  137-50 MCG/ACT SUSP Place 1 spray into the nose every 12 (twelve) hours. (Patient not taking: Reported on 06/06/2023)   fluconazole  (DIFLUCAN ) 150 MG tablet Take one tablet today, second tablet in 72 hours (Patient not taking: Reported on 06/06/2023)   fluticasone  (FLONASE) 50 MCG/ACT nasal spray Place 2 sprays into both nostrils daily. (Patient not taking: Reported on 06/06/2023)  nystatin  (MYCOSTATIN /NYSTOP ) powder Apply 1 Application topically 3 (three) times daily. Apply under breast. (Patient not taking: Reported on 06/06/2023)   No facility-administered medications prior to visit.    Review of Systems  Last CBC Lab Results  Component Value Date   WBC 8.2 11/12/2022   HGB 13.1 11/12/2022   HCT 40.9 11/12/2022   MCV 88 11/12/2022   MCH 28.1 11/12/2022   RDW 13.5 11/12/2022   PLT 269 11/12/2022   Last metabolic panel Lab Results  Component Value Date   GLUCOSE 127 (H) 11/12/2022   NA 141 11/12/2022   K 5.1 11/12/2022   CL 107 (H) 11/12/2022   CO2 18 (L) 11/12/2022   BUN 29 (H) 11/12/2022    CREATININE 1.01 (H) 11/12/2022   EGFR 63 11/12/2022   CALCIUM  10.0 11/12/2022   PROT 6.9 11/12/2022   ALBUMIN 4.4 11/12/2022   LABGLOB 2.5 11/12/2022   BILITOT 0.2 11/12/2022   ALKPHOS 56 11/12/2022   AST 15 11/12/2022   ALT 15 11/12/2022   ANIONGAP 8 02/16/2021   Last hemoglobin A1c Lab Results  Component Value Date   HGBA1C 9.2 (H) 02/26/2023   Last thyroid functions Lab Results  Component Value Date   TSH 0.869 11/12/2022        Objective    BP 124/67   Pulse 79   Ht 5\' 4"  (1.626 m)   Wt 143 lb (64.9 kg)   SpO2 99%   BMI 24.55 kg/m  BP Readings from Last 3 Encounters:  06/06/23 124/67  02/26/23 110/63  11/12/22 124/70   Wt Readings from Last 3 Encounters:  06/06/23 143 lb (64.9 kg)  02/26/23 158 lb 11.2 oz (72 kg)  11/12/22 165 lb 14.4 oz (75.3 kg)        Physical Exam  Physical Exam GENERAL: No acute distress CARD: RRR, NO MURMUR  PULM: CTAB EXTREMITIES: Capillary refill slightly delayed SKIN: Mild skin tenting    No results found for any visits on 06/06/23.  Assessment & Plan     Problem List Items Addressed This Visit       Cardiovascular and Mediastinum   Hypertension associated with diabetes (HCC) - Primary   Relevant Medications   tirzepatide (MOUNJARO) 2.5 MG/0.5ML Pen   Other Relevant Orders   CMP14+EGFR   TSH+T4F+T3Free   Essential hypertension (Chronic)   Hypertension, chronic  Hypertension is well-controlled with blood pressure within normal range during examination. - continue current regimen including metoprolol  25mg  twice daily  - continue lisinopril  5mg  daily         Endocrine   Diabetes mellitus (HCC)    Type 2 diabetes mellitus, chronic Type 2 diabetes mellitus managed with Jardiance  and Mounjaro. Current symptoms include decreased appetite, likely due to Mounjaro. Plan to assess diabetes control with lab work to evaluate the effectiveness of the current regimen and adjust if necessary. - Order hemoglobin A1c -  Order comprehensive metabolic panel - Order thyroid panel - Reorder [facility] prescription to reflect new dosage      Relevant Medications   tirzepatide [facility]) 2.5 MG/0.5ML Pen   Other Relevant Orders   Hemoglobin A1c   Other Visit Diagnoses       Anorexia       Relevant Orders   CMP14+EGFR   TSH+T4F+T3Free      Assessment & Plan Dehydration Mild dehydration likely secondary to decreased oral intake and medication effects, particularly Mounjaro. Symptoms include increased urination and decreased appetite. No vomiting or diarrhea currently, but diarrhea was  present at an urgent care visit. Mild skin tinting and slightly delayed capillary refill are present. Oral hydration is crucial due to medication-induced fluid depletion. - Decrease Mounjaro dose to 2.5 mg weekly - Encourage oral hydration with a goal of 1.5 to 2 liters of water per day - Set reminders for regular water intake        Return in about 3 months (around 09/06/2023) for CHRONIC F/U.         Mimi Alt, MD  [facility] [phone] (phone) [phone] (fax)  [facility]

## 2023-06-06 NOTE — Assessment & Plan Note (Signed)
  Type 2 diabetes mellitus, chronic Type 2 diabetes mellitus managed with Jardiance  and Mounjaro. Current symptoms include decreased appetite, likely due to Mounjaro. Plan to assess diabetes control with lab work to evaluate the effectiveness of the current regimen and adjust if necessary. - Order hemoglobin A1c - Order comprehensive metabolic panel - Order thyroid panel - Reorder Mounjaro prescription to reflect new dosage

## 2023-06-06 NOTE — Patient Instructions (Signed)
  VISIT SUMMARY: Today, you were seen for concerns about dehydration. You have been experiencing symptoms such as dry skin, dry mouth, and increased urination for about a month. You also reported a decreased appetite and lack of thirst. We discussed your current medications and their potential effects on your hydration status.  YOUR PLAN: -DEHYDRATION: Dehydration occurs when your body loses more fluids than it takes in. Your symptoms are likely due to decreased fluid intake and the effects of your medication, Mounjaro. We will decrease your Mounjaro dose to 2.5 mg weekly and encourage you to drink 1.5 to 2 liters of water per day. Setting reminders to drink water regularly can help you stay hydrated.  -TYPE 2 DIABETES MELLITUS: Type 2 diabetes is a condition where your body does not use insulin  properly, leading to high blood sugar levels. Your diabetes is currently managed with Jardiance  and Mounjaro. We will order lab tests, including hemoglobin A1c, a comprehensive metabolic panel, and a thyroid panel, to assess your diabetes control and adjust your treatment if necessary. Your Mounjaro prescription will be updated to reflect the new dosage.    INSTRUCTIONS: Please follow up with the lab work as ordered: hemoglobin A1c, comprehensive metabolic panel, and thyroid panel. Make sure to drink 1.5 to 2 liters of water daily and set reminders to help with this. Your Mounjaro prescription has been updated to the new dosage of 2.5 mg weekly. If you experience any new or worsening symptoms, please contact our office.                      Contains text generated by Abridge.                                 Contains text generated by Abridge.

## 2023-06-06 NOTE — Assessment & Plan Note (Signed)
 Hypertension, chronic  Hypertension is well-controlled with blood pressure within normal range during examination. - continue current regimen including metoprolol  25mg  twice daily  - continue lisinopril  5mg  daily

## 2023-06-07 LAB — CMP14+EGFR
ALT: 16 IU/L (ref 0–32)
AST: 21 IU/L (ref 0–40)
Albumin: 4.4 g/dL (ref 3.9–4.9)
Alkaline Phosphatase: 55 IU/L (ref 44–121)
BUN/Creatinine Ratio: 17 (ref 12–28)
BUN: 18 mg/dL (ref 8–27)
Bilirubin Total: 0.4 mg/dL (ref 0.0–1.2)
CO2: 20 mmol/L (ref 20–29)
Calcium: 10 mg/dL (ref 8.7–10.3)
Chloride: 104 mmol/L (ref 96–106)
Creatinine, Ser: 1.03 mg/dL — ABNORMAL HIGH (ref 0.57–1.00)
Globulin, Total: 2.2 g/dL (ref 1.5–4.5)
Glucose: 122 mg/dL — ABNORMAL HIGH (ref 70–99)
Potassium: 5.9 mmol/L — ABNORMAL HIGH (ref 3.5–5.2)
Sodium: 140 mmol/L (ref 134–144)
Total Protein: 6.6 g/dL (ref 6.0–8.5)
eGFR: 61 mL/min/{1.73_m2} (ref >59)

## 2023-06-07 LAB — TSH+T4F+T3FREE
Free T4: 1.23 ng/dL (ref 0.82–1.77)
T3, Free: 2.6 pg/mL (ref 2.0–4.4)
TSH: 0.956 u[IU]/mL (ref 0.450–4.500)

## 2023-06-07 LAB — HEMOGLOBIN A1C
Est. average glucose Bld gHb Est-mCnc: 174 mg/dL
Hgb A1c MFr Bld: 7.7 % — ABNORMAL HIGH (ref 4.8–5.6)

## 2023-06-09 ENCOUNTER — Ambulatory Visit: Payer: Self-pay | Admitting: Family Medicine

## 2023-06-09 MED ORDER — LOKELMA 10 G PO PACK: 10.0000 g | PACK | Freq: Every day | ORAL | 0 refills | Status: DC

## 2023-06-09 MED ORDER — LOKELMA 10 G PO PACK: 10.0000 g | PACK | Freq: Every day | ORAL | 0 refills | Status: AC

## 2023-06-09 NOTE — Addendum Note (Signed)
 Addended by: SIMMONS-ROBINSON, Bee Marchiano L on: 06/09/2023 07:35 AM   Modules accepted: Orders

## 2023-06-10 ENCOUNTER — Telehealth: Payer: Self-pay

## 2023-06-10 NOTE — Telephone Encounter (Signed)
 Copied from CRM [id number]. Topic: Clinical - Lab/Test Results >> Jun 10, 2023 11:25 AM Hassie Lint wrote: Reason for CRM: Patients husband returning call in regards to patients lab results, he is listed on DPR. Has follow up questions and is requesting a call back.  Sammie Crigler can be reached at [phone]

## 2023-06-10 NOTE — Telephone Encounter (Signed)
 Spoke with patient husband, called back to inform his wife test results and medication being sent into pharmacy. Patient was made aware it was ready for pick up.

## 2023-06-17 ENCOUNTER — Telehealth: Payer: Self-pay

## 2023-06-17 ENCOUNTER — Other Ambulatory Visit (HOSPITAL_COMMUNITY): Payer: Self-pay

## 2023-06-17 NOTE — Telephone Encounter (Signed)
 Pharmacy Patient Advocate Encounter   Received notification from Onbase that prior authorization for Jardiance  25MG  tablets is required/requested.   Insurance verification completed.   The patient is insured through [employer] .   Per test claim: PA required; PA submitted to above mentioned insurance via CoverMyMeds Key/confirmation #/EOC [id number] Status is pending

## 2023-06-18 ENCOUNTER — Ambulatory Visit: Payer: 59 | Admitting: Family Medicine

## 2023-06-18 ENCOUNTER — Telehealth: Payer: Self-pay | Admitting: Family Medicine

## 2023-06-18 DIAGNOSIS — E1169 Type 2 diabetes mellitus with other specified complication: Secondary | ICD-10-CM

## 2023-06-18 MED ORDER — EMPAGLIFLOZIN 25 MG PO TABS: 25.0000 mg | ORAL_TABLET | Freq: Every day | ORAL | 0 refills | Status: DC

## 2023-06-18 NOTE — Telephone Encounter (Signed)
 [facility] pharmacy is requesting refill empagliflozin  (JARDIANCE ) 25 MG TABS tablet  Please advise

## 2023-06-23 MED ORDER — LOKELMA 10 G PO PACK: 10.0000 g | PACK | Freq: Every day | ORAL | 0 refills | Status: AC

## 2023-07-10 ENCOUNTER — Other Ambulatory Visit (HOSPITAL_COMMUNITY): Payer: Self-pay

## 2023-07-10 NOTE — Telephone Encounter (Signed)
 Tried to call the insurance company to get an update but I was forced to leave a message. Waiting on a return call.

## 2023-07-17 ENCOUNTER — Other Ambulatory Visit (HOSPITAL_COMMUNITY): Payer: Self-pay

## 2023-07-22 ENCOUNTER — Other Ambulatory Visit: Payer: Self-pay | Admitting: Family Medicine

## 2023-07-22 DIAGNOSIS — E1169 Type 2 diabetes mellitus with other specified complication: Secondary | ICD-10-CM

## 2023-07-23 NOTE — Telephone Encounter (Signed)
 Requested Prescriptions  Pending Prescriptions Disp Refills   rosuvastatin  (CRESTOR ) 10 MG tablet [Pharmacy Med Name: ROSUVASTATIN  10MG  TAB] 90 tablet 0    Sig: Take 1 tablet by mouth once daily     Cardiovascular:  Antilipid - Statins 2 Failed - 07/23/2023  3:19 PM      Failed - Cr in normal range and within 360 days    Creatinine, Ser  Date Value Ref Range Status  06/06/2023 1.03 (H) 0.57 - 1.00 mg/dL Final         Failed - Lipid Panel in normal range within the last 12 months    Cholesterol, Total  Date Value Ref Range Status  11/12/2022 200 (H) 100 - 199 mg/dL Final   LDL Chol Calc (NIH)  Date Value Ref Range Status  11/12/2022 120 (H) 0 - 99 mg/dL Final   HDL  Date Value Ref Range Status  11/12/2022 49 >39 mg/dL Final   Triglycerides  Date Value Ref Range Status  11/12/2022 179 (H) 0 - 149 mg/dL Final         Passed - Patient is not pregnant      Passed - Valid encounter within last 12 months    Recent Outpatient Visits           1 month ago Hypertension associated with diabetes (HCC)   [place] [facility] Simmons-Robinson, [place], MD   4 months ago Type 2 diabetes mellitus with other specified complication, without long-term current use of insulin  [facility])   [place] [facility] [place], [place], [place]

## 2023-07-29 ENCOUNTER — Other Ambulatory Visit: Payer: Self-pay | Admitting: Family Medicine

## 2023-07-29 ENCOUNTER — Other Ambulatory Visit: Payer: Self-pay | Admitting: Pharmacist

## 2023-07-29 DIAGNOSIS — E1169 Type 2 diabetes mellitus with other specified complication: Secondary | ICD-10-CM

## 2023-07-29 NOTE — Progress Notes (Signed)
   07/29/2023  Patient ID: Elizabeth Hardin, female   DOB: [birth year], 63 y.o.   MRN: [id number]  Called and spoke with the spouse on the phone today. Reports patient seems to be doing better in regards to dehydration concerns from prior. Reports she is currently out of her Mounjaro  2.5mg  weekly.  [facility] pharmacy at [phone]. Was unable to get a hold of a pharmacy staff member. Left voicemail requesting refill of Mounjaro  2.5mg  weekly and getting a call back to ensure refills are on file. If not, will resend but need the 2.5mg  going forward.   Regarding potassium, patient did not take the Lokelma  as it was about $100 and they could not afford it. Also did not return in 1 week for repeat potassium check. Reviewed high potassium foods and the only one listed was potatoes as he said these are one of her favorites to eat. Strongly advised to minimize these currently and to contact PCP office if arrhythmias occur.   Received call back from pharmacy at 4:40PM- they will refill the Mounjaro  2.5mg  weekly at this time.    Aloysius Lewis, PharmD [facility]  Phone Number: [phone]

## 2023-07-29 NOTE — Telephone Encounter (Signed)
 Copied from CRM [id number]. Topic: Clinical - Medication Refill >> Jul 29, 2023 10:22 AM Turkey B wrote: Medication: tirzepatide  (MOUNJARO ) 2.5 MG/0.5ML Pen Pt requesting extra of these so they dont have to call in as often  Has the patient contacted their pharmacy? No Pt's husband said they have to call this in  This is the patient's preferred pharmacy:  [facility] [place], [place] - [address] [address] [phone] [address] [place] [address] Phone: [phone] Fax: [phone]   Is this the correct pharmacy for this prescription? yes  Has the prescription been filled recently? no  Is the patient out of the medication? yes  Has the patient been seen for an appointment in the last year OR does the patient have an upcoming appointment? yes  Can we respond through MyChart? no  Agent: Please be advised that Rx refills may take up to 3 business days. We ask that you follow-up with your pharmacy.

## 2023-07-31 MED ORDER — TIRZEPATIDE 2.5 MG/0.5ML ~~LOC~~ SOAJ
2.5000 mg | SUBCUTANEOUS | 3 refills | Status: DC
Start: 2023-07-31 — End: 2023-10-29

## 2023-07-31 NOTE — Telephone Encounter (Signed)
 Requested medication (s) are due for refill today: yes  Requested medication (s) are on the active medication list: yes  Last refill:  06/06/23 2 ml 3 RF  Future visit scheduled: yes  Notes to clinic:  med not assigned to a protocol   Requested Prescriptions  Pending Prescriptions Disp Refills   tirzepatide  (MOUNJARO ) 2.5 MG/0.5ML Pen 2 mL 3    Sig: Inject 2.5 mg into the skin once a week.     Off-Protocol Failed - 07/31/2023  1:22 PM      Failed - Medication not assigned to a protocol, review manually.      Passed - Valid encounter within last 12 months    Recent Outpatient Visits           1 month ago Hypertension associated with diabetes [facility])   [place] [facility] Simmons-Robinson, [place], [place]   5 months ago Type 2 diabetes mellitus with other specified complication, without long-term current use of insulin  [facility])   [place] [facility] [place], [place], [place]

## 2023-09-05 ENCOUNTER — Ambulatory Visit: Admitting: Family Medicine

## 2023-10-06 ENCOUNTER — Other Ambulatory Visit: Payer: Self-pay | Admitting: Family Medicine

## 2023-10-06 DIAGNOSIS — E1169 Type 2 diabetes mellitus with other specified complication: Secondary | ICD-10-CM

## 2023-10-06 NOTE — Telephone Encounter (Unsigned)
 Copied from CRM [id number]. Topic: Clinical - Medication Refill >> Oct 06, 2023 10:31 AM Suzen RAMAN wrote: Medication: tirzepatide  (MOUNJARO ) 2.5 MG/0.5ML Pen 90 days supply   Has the patient contacted their pharmacy? Yes   This is the patient's preferred pharmacy:  [facility] [place], [place] - [address] [address] [address] [address] [place] [address] Phone: [phone] Fax: [phone]   Is this the correct pharmacy for this prescription? Yes If no, delete pharmacy and type the correct one.   Has the prescription been filled recently? No  Is the patient out of the medication? Not sure  Has the patient been seen for an appointment in the last year OR does the patient have an upcoming appointment? Yes  Can we respond through MyChart? Yes  Agent: Please be advised that Rx refills may take up to 3 business days. We ask that you follow-up with your pharmacy.

## 2023-10-07 NOTE — Telephone Encounter (Signed)
 Requested Prescriptions  Refused Prescriptions Disp Refills   tirzepatide  (MOUNJARO ) 2.5 MG/0.5ML Pen 2 mL 3    Sig: Inject 2.5 mg into the skin once a week.     Off-Protocol Failed - 10/07/2023  3:48 PM      Failed - Medication not assigned to a protocol, review manually.      Passed - Valid encounter within last 12 months    Recent Outpatient Visits           4 months ago Hypertension associated with diabetes [facility])   [place] [facility] Simmons-Robinson, [place], MD   7 months ago Type 2 diabetes mellitus with other specified complication, without long-term current use of insulin  [facility])    [facility] [place], [place], [place]

## 2023-10-21 NOTE — Telephone Encounter (Signed)
 Pharmacy called to report that the patient is completely out and is having this mail ordered so they want to ensure that it gets to her at the appropriate time.

## 2023-10-29 ENCOUNTER — Other Ambulatory Visit: Payer: Self-pay | Admitting: Family Medicine

## 2023-10-29 DIAGNOSIS — E1169 Type 2 diabetes mellitus with other specified complication: Secondary | ICD-10-CM

## 2023-10-29 NOTE — Telephone Encounter (Unsigned)
 Copied from CRM #[id number]. Topic: Clinical - Medication Refill >> Oct 29, 2023  4:52 PM Shanda MATSU wrote: Medication: tirzepatide  (MOUNJARO ) 2.5 MG/0.5ML Pen  Has the patient contacted their pharmacy? Yes (Agent: If no, request that the patient contact the pharmacy for the refill. If patient does not wish to contact the pharmacy document the reason why and proceed with request.) (Agent: If yes, when and what did the pharmacy advise?)  This is the patient's preferred pharmacy:  [facility] [place], [place] - [address] [address] [phone] [address] [place] [address] Phone: [phone] Fax: [phone]   Is this the correct pharmacy for this prescription? Yes If no, delete pharmacy and type the correct one.   Has the prescription been filled recently? No  Is the patient out of the medication? Yes  Has the patient been seen for an appointment in the last year OR does the patient have an upcoming appointment? Yes  Can we respond through MyChart? Yes  Agent: Please be advised that Rx refills may take up to 3 business days. We ask that you follow-up with your pharmacy.

## 2023-10-30 ENCOUNTER — Other Ambulatory Visit: Payer: Self-pay | Admitting: Family Medicine

## 2023-10-30 DIAGNOSIS — E1169 Type 2 diabetes mellitus with other specified complication: Secondary | ICD-10-CM

## 2023-10-31 MED ORDER — TIRZEPATIDE 2.5 MG/0.5ML ~~LOC~~ SOAJ: 2.5000 mg | SUBCUTANEOUS | 2 refills | Status: AC

## 2023-10-31 NOTE — Telephone Encounter (Signed)
 Requested medication (s) are due for refill today:   Requested medication (s) are on the active medication list: Yes  Last refill:  07/31/23  Future visit scheduled: No  Notes to clinic:  Manual review.    Requested Prescriptions  Pending Prescriptions Disp Refills   tirzepatide  (MOUNJARO ) 2.5 MG/0.5ML Pen 2 mL 3    Sig: Inject 2.5 mg into the skin once a week.     Off-Protocol Failed - 10/31/2023  2:07 PM      Failed - Medication not assigned to a protocol, review manually.      Passed - Valid encounter within last 12 months    Recent Outpatient Visits           4 months ago Hypertension associated with diabetes [facility])   [place] [facility] Simmons-Robinson, [place], [place]   8 months ago Type 2 diabetes mellitus with other specified complication, without long-term current use of insulin  [facility])   [place] [facility] [place], [place], [place]

## 2023-11-10 ENCOUNTER — Emergency Department
Admission: EM | Admit: 2023-11-10 | Discharge: 2023-11-10 | Disposition: A | Discharge status: Home / Self Care | Payer: Worker's Compensation | Attending: Emergency Medicine | Admitting: Emergency Medicine

## 2023-11-10 ENCOUNTER — Other Ambulatory Visit: Payer: Self-pay

## 2023-11-10 ENCOUNTER — Emergency Department: Payer: Worker's Compensation

## 2023-11-10 DIAGNOSIS — E119 Type 2 diabetes mellitus without complications: Secondary | ICD-10-CM | POA: Diagnosis not present

## 2023-11-10 DIAGNOSIS — S5002XA Contusion of left elbow, initial encounter: Secondary | ICD-10-CM | POA: Insufficient documentation

## 2023-11-10 DIAGNOSIS — Y990 Civilian activity done for income or pay: Secondary | ICD-10-CM | POA: Diagnosis not present

## 2023-11-10 DIAGNOSIS — I2510 Atherosclerotic heart disease of native coronary artery without angina pectoris: Secondary | ICD-10-CM | POA: Insufficient documentation

## 2023-11-10 DIAGNOSIS — S59902A Unspecified injury of left elbow, initial encounter: Secondary | ICD-10-CM | POA: Diagnosis present

## 2023-11-10 DIAGNOSIS — W19XXXA Unspecified fall, initial encounter: Secondary | ICD-10-CM

## 2023-11-10 DIAGNOSIS — I10 Essential (primary) hypertension: Secondary | ICD-10-CM | POA: Diagnosis not present

## 2023-11-10 DIAGNOSIS — S8001XA Contusion of right knee, initial encounter: Secondary | ICD-10-CM | POA: Diagnosis not present

## 2023-11-10 DIAGNOSIS — S7012XA Contusion of left thigh, initial encounter: Secondary | ICD-10-CM | POA: Diagnosis not present

## 2023-11-10 DIAGNOSIS — W1842XA Slipping, tripping and stumbling without falling due to stepping into hole or opening, initial encounter: Secondary | ICD-10-CM | POA: Insufficient documentation

## 2023-11-10 MED ORDER — METHOCARBAMOL 500 MG PO TABS
500.0000 mg | ORAL_TABLET | Freq: Three times a day (TID) | ORAL | 0 refills | Status: AC | PRN
Start: 2023-11-10 — End: 2023-11-15

## 2023-11-10 MED ORDER — OXYCODONE-ACETAMINOPHEN 5-325 MG PO TABS
1.0000 | ORAL_TABLET | Freq: Once | ORAL | Status: AC
Start: 2023-11-10 — End: 2023-11-10
  Administered 2023-11-10: 1 via ORAL
  Filled 2023-11-10: qty 1

## 2023-11-10 NOTE — ED Provider Notes (Addendum)
 [facility] Provider Note    Event Date/Time   First MD Initiated Contact with Patient 11/10/23 1449     (approximate)   History   Fall   HPI  Elizabeth Hardin is a 63 y.o. female  with a past medical history of diabetes, hypertension, hyperlipidemia, CAD, history of STEMI presents to the emergency department after a fall at work that occurred around 10 or 11 AM today.  Patient states she was working with 2 coworkers and stepped through a hole in concrete with her left leg and her right leg bent backwards and she landed onto her arms.  Patient denies hitting her head, loss of consciousness, abdominal pain, chest pain, back pain, difficulty breathing, or any other concerns.  Patient ambulates on her own at home. Patient does take 81 mg aspirin  daily.  She has not taken any medications for pain since the incident.   Physical Exam   Triage Vital Signs: ED Triage Vitals  Encounter Vitals Group     BP 11/10/23 1300 116/66     Girls Systolic BP Percentile --      Girls Diastolic BP Percentile --      Boys Systolic BP Percentile --      Boys Diastolic BP Percentile --      Pulse Rate 11/10/23 1300 73     Resp 11/10/23 1300 18     Temp 11/10/23 1300 97.7 F (36.5 C)     Temp Source 11/10/23 1300 Oral     SpO2 11/10/23 1300 100 %     Weight 11/10/23 1300 140 lb (63.5 kg)     Height 11/10/23 1300 5' 4 (1.626 m)     Head Circumference --      Peak Flow --      Pain Score 11/10/23 1311 10     Pain Loc --      Pain Education --      Exclude from Growth Chart --     Most recent vital signs: Vitals:   11/10/23 1300 11/10/23 1605  BP: 116/66 105/68  Pulse: 73 62  Resp: 18 18  Temp: 97.7 F (36.5 C)   SpO2: 100% 100%    General: Awake, in no acute distress. Appears stated age. Head: Normocephalic, atraumatic. Eyes: PERRLA. No scleral icterus or conjunctival injection. Ears/Nose/Throat: Nares patent, no nasal discharge. Oropharynx moist, no erythema  or exudate. Dentition intact. Neck: Supple, no midline cervical tenderness. CV: Good peripheral perfusion.  Respiratory:Normal respiratory effort.  No respiratory distress. CTAB. GI: Soft, non-distended, non-tender.  MSK: Normal ROM and  5/5 strength in b/l upper and lower extremities.  Able to stand and ambulate with assistance. TTP along left olecranon process, left anterior thigh and right anterior knee. Skin:Warm, dry, intact. Ecchymosis and mild swelling to the left elbow, left thigh and right knee. Neurological: A&Ox4 to person, place, time, and situation. Sensation intact. Strength symmetric. No focal deficits.   ED Results / Procedures / Treatments   Labs (all labs ordered are listed, but only abnormal results are displayed) Labs Reviewed - No data to display   EKG     RADIOLOGY X-rays of left femur, left elbow and right knee ordered.   PROCEDURES:  Critical Care performed: No   Procedures   MEDICATIONS ORDERED IN ED: Medications  oxyCODONE-acetaminophen  (PERCOCET/ROXICET) 5-325 MG per tablet 1 tablet (1 tablet Oral Given 11/10/23 1527)     IMPRESSION / MDM / ASSESSMENT AND PLAN / ED COURSE  I reviewed  the triage vital signs and the nursing notes.                              Differential diagnosis includes, but is not limited to, mechanical fall, elbow contusion, knee contusion, back contusion, musculoskeletal strain, elbow fracture, knee fracture, femur fracture  Patient's presentation is most consistent with acute complicated illness / injury requiring diagnostic workup.  Patient is a 63 year old female presenting following a mechanical fall today after stepping through a hole at work.  She is in some pain following the incident but is otherwise well-appearing with vital signs in normal range.  X-rays of the left elbow, left femur and right knee ordered in triage. I independently viewed the x-rays and radiologist's report.  I agree with the radiologist's  report that there are no acute findings of the left elbow, left femur or right knee other than some dorsal soft tissue swelling of the left elbow. She was able to perform all motions of her upper and lower extremities. Including this elbow and able to stand and ambulate with some assistance.  No erythema or excessive warmth of the elbow on exam, the patient stated she did not have any swelling prior to the accident today.  Offered a walker, but she denied at this time.  Did give her a dose of Percocet here for her pain.  Will send her home with recommendation of ice, heating pad, Tylenol  and methocarbamol  prescription.  Did discuss precautions regarding taking this medication.  Work note provided.  She was encouraged to follow-up with her primary care provider following today's visit.  The patient may return to the emergency department for any new, worsening, or concerning symptoms. Patient was given the opportunity to ask questions; all questions were answered. Emergency department return precautions were discussed with the patient.  Patient is in agreement to the treatment plan.  Patient is stable for discharge.   FINAL CLINICAL IMPRESSION(S) / ED DIAGNOSES   Final diagnoses:  Fall, initial encounter  Contusion of left elbow, initial encounter  Contusion of right knee, initial encounter  Contusion of left thigh, initial encounter     Rx / DC Orders   ED Discharge Orders          Ordered    methocarbamol  (ROBAXIN ) 500 MG tablet  Every 8 hours PRN        11/10/23 1551             Note:  This document was prepared using Dragon voice recognition software and may include unintentional dictation errors.     Sheron Salm, PA-C 11/10/23 1622    [address], [place], PA-C 11/10/23 1625    Claudene Rover, MD 11/10/23 2031

## 2023-11-10 NOTE — Discharge Instructions (Addendum)
 You have been seen in the Emergency Department (ED) today for a fall.  Your work up does not show any concerning injuries.  Please take over-the-counter Tylenol  as needed for your pain (unless you have an allergy or your doctor as told you not to take them), or take any prescribed medication as instructed. You may use ice or heat on any area that hurts.  You were prescribed Methocarbamol  (muscle relaxer) to help with your pain. Please take this medication only as prescribed. Please do not work, make legal-binding decisions, drink alcohol, get up on ladders or heights, or operate a motor vehicle or machinery while taking the Methocarbamol . Please do not take any other muscle relaxer including Flexeril /cyclobenzaprine  while taking this medication.  Please follow up with your doctor regarding today's Emergency Department (ED) visit and your recent fall.    Here is a picture of the lidocaine  cream

## 2023-11-10 NOTE — ED Triage Notes (Signed)
 Pt arrives via POV after a fall at work where their left leg went into a hole and their right leg slammed into the concrete. Pt states that their left elbow in also in pain. Pt states this happened around 1100 today. Pt is able to move everything but says the pain is getting worse. Pt is A&Ox4 during triage.

## 2023-11-19 ENCOUNTER — Other Ambulatory Visit: Payer: Self-pay

## 2023-11-19 ENCOUNTER — Telehealth: Payer: Self-pay | Admitting: Family Medicine

## 2023-11-19 DIAGNOSIS — E1169 Type 2 diabetes mellitus with other specified complication: Secondary | ICD-10-CM

## 2023-11-19 MED ORDER — EMPAGLIFLOZIN 25 MG PO TABS: 25.0000 mg | ORAL_TABLET | Freq: Every day | ORAL | 0 refills | Status: DC

## 2023-11-19 NOTE — Telephone Encounter (Signed)
 [facility] faxed refill request for the following medications:   empagliflozin (JARDIANCE) 25 MG TABS tablet      Please advise.

## 2023-11-19 NOTE — Telephone Encounter (Signed)
 Courtesy refill will be sent in. Pt needs to schedule an OV.

## 2024-01-26 ENCOUNTER — Telehealth: Payer: Self-pay | Admitting: Family Medicine

## 2024-01-27 DIAGNOSIS — E1169 Type 2 diabetes mellitus with other specified complication: Secondary | ICD-10-CM

## 2024-01-27 MED ORDER — ROSUVASTATIN CALCIUM 10 MG PO TABS: 10.0000 mg | ORAL_TABLET | Freq: Every day | ORAL | 0 refills | Status: AC

## 2024-01-30 ENCOUNTER — Telehealth: Payer: Self-pay

## 2024-01-30 DIAGNOSIS — E1169 Type 2 diabetes mellitus with other specified complication: Secondary | ICD-10-CM

## 2024-01-30 MED ORDER — EMPAGLIFLOZIN 25 MG PO TABS: 25.0000 mg | ORAL_TABLET | Freq: Every day | ORAL | 0 refills | Status: AC

## 2024-01-30 NOTE — Telephone Encounter (Signed)
 Copied from CRM [id number]. Topic: Clinical - Prescription Issue >> Jan 30, 2024  9:18 AM Avram MATSU wrote: Reason for CRM: Dino stated she sent in a rx request for empagliflozin  (JARDIANCE ) 25 MG TABS tablet [[id number]] on 01/07/24. Pt is needing a refill    [facility] [place], [place] - [address] [address] [address] [address] [place] [address] Phone: [phone] Fax: [phone]

## 2024-01-30 NOTE — Telephone Encounter (Signed)
 Courtesy Rx Sent Patient needs to be seen in office for further refills LOV 06/06/23 and was advised to return in 3 months for chronic care follow-up.   E2C2 - Please advise courtesy sent and needs to be seen in office as she is over due for office visit for chronic concerns.

## 2024-02-17 ENCOUNTER — Other Ambulatory Visit: Payer: Self-pay | Admitting: Family Medicine

## 2024-02-17 DIAGNOSIS — E1169 Type 2 diabetes mellitus with other specified complication: Secondary | ICD-10-CM

## 2024-02-17 NOTE — Telephone Encounter (Unsigned)
 Copied from CRM #[id number]. Topic: Clinical - Medication Refill >> Feb 17, 2024 10:10 AM Hadassah PARAS wrote: Medication: tirzepatide  (MOUNJARO ) 2.5 MG/0.5ML Pen   Has the patient contacted their pharmacy? No (Agent: If no, request that the patient contact the pharmacy for the refill. If patient does not wish to contact the pharmacy document the reason why and proceed with request.) (Agent: If yes, when and what did the pharmacy advise?)  This is the patient's preferred pharmacy:  [facility] [place], [place] - [address] [address] [phone] [address] [place] [address] Phone: [phone] Fax: [phone]    Is this the correct pharmacy for this prescription? Yes If no, delete pharmacy and type the correct one.   Has the prescription been filled recently? Yes  Is the patient out of the medication? Yes  Has the patient been seen for an appointment in the last year OR does the patient have an upcoming appointment? Yes  Can we respond through MyChart? No  Agent: Please be advised that Rx refills may take up to 3 business days. We ask that you follow-up with your pharmacy.

## 2024-02-18 NOTE — Telephone Encounter (Signed)
 Requested medication (s) are due for refill today: yes  Requested medication (s) are on the active medication list: yes  Last refill:  10/31/23  Future visit scheduled: no  Notes to clinic:  Medication not assigned to a protocol, review manually.      Requested Prescriptions  Pending Prescriptions Disp Refills   tirzepatide  (MOUNJARO ) 2.5 MG/0.5ML Pen 2 mL 2    Sig: Inject 2.5 mg into the skin once a week.     Off-Protocol Failed - 02/18/2024  9:39 AM      Failed - Medication not assigned to a protocol, review manually.      Passed - Valid encounter within last 12 months    Recent Outpatient Visits           8 months ago Hypertension associated with diabetes [facility])   [place] [facility], [place], [place]   11 months ago Type 2 diabetes mellitus with other specified complication, without long-term current use of insulin  [facility])   [place] [facility] [place], [place] [facility], [place]

## 2024-02-18 NOTE — Telephone Encounter (Signed)
 Requested medication (s) are due for refill today: yes  Requested medication (s) are on the active medication list: yes  Last refill:  10/31/23  Future visit scheduled: no  Notes to clinic:  Medication not assigned to a protocol, review manually.      Requested Prescriptions  Pending Prescriptions Disp Refills   tirzepatide  (MOUNJARO ) 2.5 MG/0.5ML Pen 2 mL 2    Sig: Inject 2.5 mg into the skin once a week.     Off-Protocol Failed - 02/18/2024  9:36 AM      Failed - Medication not assigned to a protocol, review manually.      Passed - Valid encounter within last 12 months    Recent Outpatient Visits           8 months ago Hypertension associated with diabetes [facility])   [place] [facility], [place], [place]   11 months ago Type 2 diabetes mellitus with other specified complication, without long-term current use of insulin  [facility])   [place] [facility] [place], [place] [facility], [place]
